# Patient Record
Sex: Female | Born: 1970
Health system: Southern US, Community
[De-identification: ages and names within clinical notes are randomized; demographics above are authoritative.]

## PROBLEM LIST (undated history)

## (undated) DIAGNOSIS — C50919 Malignant neoplasm of unspecified site of unspecified female breast: Secondary | ICD-10-CM

## (undated) DIAGNOSIS — F32A Depression, unspecified: Secondary | ICD-10-CM

## (undated) DIAGNOSIS — G47419 Narcolepsy without cataplexy: Secondary | ICD-10-CM

## (undated) DIAGNOSIS — C50911 Malignant neoplasm of unspecified site of right female breast: Secondary | ICD-10-CM

## (undated) DIAGNOSIS — F329 Major depressive disorder, single episode, unspecified: Secondary | ICD-10-CM

## (undated) HISTORY — DX: Narcolepsy without cataplexy: G47.419

## (undated) HISTORY — DX: Malignant neoplasm of unspecified site of unspecified female breast: C50.919

## (undated) HISTORY — DX: Major depressive disorder, single episode, unspecified: F32.9

## (undated) HISTORY — DX: Depression, unspecified: F32.A

## (undated) HISTORY — PX: CHOLECYSTECTOMY: SHX55

## (undated) HISTORY — DX: Malignant neoplasm of unspecified site of right female breast: C50.911

---

## 1998-07-04 ENCOUNTER — Encounter: Admission: RE | Admit: 1998-07-04 | Discharge: 1998-10-02 | Payer: Self-pay | Admitting: *Deleted

## 2004-01-30 ENCOUNTER — Inpatient Hospital Stay (HOSPITAL_COMMUNITY): Admission: AD | Admit: 2004-01-30 | Discharge: 2004-01-30 | Payer: Self-pay | Admitting: Family Medicine

## 2016-10-16 ENCOUNTER — Telehealth (HOSPITAL_COMMUNITY): Payer: Self-pay | Admitting: Emergency Medicine

## 2016-10-16 NOTE — Telephone Encounter (Signed)
Called pt and introduced myself.  Explained the role of the navigator.  Made sure she had my contact information.  Pt has not seen a surgeon yet but when she does she would like for it to be in [place].

## 2016-10-17 ENCOUNTER — Encounter (HOSPITAL_COMMUNITY): Payer: Self-pay | Admitting: Oncology

## 2016-10-17 DIAGNOSIS — C50911 Malignant neoplasm of unspecified site of right female breast: Secondary | ICD-10-CM | POA: Insufficient documentation

## 2016-10-17 HISTORY — DX: Malignant neoplasm of unspecified site of right female breast: C50.911

## 2016-10-23 ENCOUNTER — Encounter (HOSPITAL_COMMUNITY): Payer: Self-pay | Admitting: Hematology

## 2016-10-23 ENCOUNTER — Encounter (HOSPITAL_COMMUNITY): Payer: 59 | Attending: Hematology | Admitting: Hematology

## 2016-10-23 ENCOUNTER — Encounter (HOSPITAL_COMMUNITY): Payer: 59 | Attending: Oncology

## 2016-10-23 VITALS — BP 148/91 | HR 104 | Temp 98.6°F | Resp 18 | Ht 67.0 in | Wt 185.0 lb

## 2016-10-23 DIAGNOSIS — C50111 Malignant neoplasm of central portion of right female breast: Secondary | ICD-10-CM

## 2016-10-23 DIAGNOSIS — Z808 Family history of malignant neoplasm of other organs or systems: Secondary | ICD-10-CM | POA: Diagnosis not present

## 2016-10-23 DIAGNOSIS — Z803 Family history of malignant neoplasm of breast: Secondary | ICD-10-CM

## 2016-10-23 DIAGNOSIS — C50311 Malignant neoplasm of lower-inner quadrant of right female breast: Secondary | ICD-10-CM

## 2016-10-23 DIAGNOSIS — Z170 Estrogen receptor positive status [ER+]: Secondary | ICD-10-CM | POA: Diagnosis not present

## 2016-10-23 DIAGNOSIS — Z809 Family history of malignant neoplasm, unspecified: Secondary | ICD-10-CM | POA: Diagnosis not present

## 2016-10-23 DIAGNOSIS — Z807 Family history of other malignant neoplasms of lymphoid, hematopoietic and related tissues: Secondary | ICD-10-CM | POA: Diagnosis not present

## 2016-10-23 LAB — CBC WITH DIFFERENTIAL/PLATELET
BASOS ABS: 0 10*3/uL (ref 0.0–0.1)
BASOS PCT: 0 %
EOS ABS: 0.1 10*3/uL (ref 0.0–0.7)
EOS PCT: 1 %
HCT: 40.3 % (ref 36.0–46.0)
HEMOGLOBIN: 14.1 g/dL (ref 12.0–15.0)
LYMPHS ABS: 2.2 10*3/uL (ref 0.7–4.0)
LYMPHS PCT: 21 %
MCH: 30.5 pg (ref 26.0–34.0)
MCHC: 35 g/dL (ref 30.0–36.0)
MCV: 87.2 fL (ref 78.0–100.0)
Monocytes Absolute: 0.7 10*3/uL (ref 0.1–1.0)
Monocytes Relative: 7 %
NEUTROS ABS: 7.3 10*3/uL (ref 1.7–7.7)
NEUTROS PCT: 71 %
PLATELETS: 321 10*3/uL (ref 150–400)
RBC: 4.62 MIL/uL (ref 3.87–5.11)
RDW: 12.6 % (ref 11.5–15.5)
WBC: 10.3 10*3/uL (ref 4.0–10.5)

## 2016-10-23 LAB — COMPREHENSIVE METABOLIC PANEL
ALBUMIN: 4.1 g/dL (ref 3.5–5.0)
ALK PHOS: 79 U/L (ref 38–126)
ALT: 27 U/L (ref 14–54)
AST: 21 U/L (ref 15–41)
Anion gap: 8 (ref 5–15)
BUN: 6 mg/dL (ref 6–20)
CHLORIDE: 100 mmol/L — AB (ref 101–111)
CO2: 28 mmol/L (ref 22–32)
CREATININE: 0.71 mg/dL (ref 0.44–1.00)
Calcium: 9.7 mg/dL (ref 8.9–10.3)
GFR calc non Af Amer: >60 mL/min (ref >60)
GLUCOSE: 94 mg/dL (ref 65–99)
Potassium: 3.3 mmol/L — ABNORMAL LOW (ref 3.5–5.1)
SODIUM: 136 mmol/L (ref 135–145)
Total Bilirubin: 0.5 mg/dL (ref 0.3–1.2)
Total Protein: 7.3 g/dL (ref 6.5–8.1)

## 2016-10-23 NOTE — Patient Instructions (Signed)
[place] at [facility] Discharge Instructions  RECOMMENDATIONS MADE BY THE CONSULTANT AND ANY TEST RESULTS WILL BE SENT TO YOUR REFERRING PHYSICIAN.  You were seen today by Dr. Irene Limbo We will refer you to Dr. Arnoldo Morale for surgery We will refer you to our genetic counselor We will refer you to radiation oncology in [facility] will have lab work today and we will call you with those results. Follow up after surgery See Amy up front for appointments   Thank you for choosing [place] at [facility] to provide your oncology and hematology care.  To afford each patient quality time with our provider, please arrive at least 15 minutes before your scheduled appointment time.    If you have a lab appointment with the [facility] please come in thru the  Main Entrance and check in at the main information desk  You need to re-schedule your appointment should you arrive 10 or more minutes late.  We strive to give you quality time with our providers, and arriving late affects you and other patients whose appointments are after yours.  Also, if you no show three or more times for appointments you may be dismissed from the clinic at the providers discretion.     Again, thank you for choosing [facility]  Our hope is that these requests will decrease the amount of time that you wait before being seen by our physicians.       _____________________________________________________________  Should you have questions after your visit to [facility], please contact our office at [phone] between the hours of 8:30 a.m. and 4:30 p.m.  Voicemails left after 4:30 p.m. will not be returned until the following business day.  For prescription refill requests, have your pharmacy contact our office.       Resources For Cancer Patients and their Caregivers ? American Cancer Society: Can assist with transportation, wigs, general needs,  runs Look Good Feel Better.        670-682-9452 ? Cancer Care: Provides financial assistance, online support groups, medication/co-pay assistance.  1-800-813-HOPE 215-745-9134) ? [place] Assists [place] cancer patients and their families through emotional , educational and financial support.  406-490-2207 ? [place] DSS Where to apply for food stamps, Medicaid and utility assistance. 202-793-6182 ? [facility]: Transportation to medical appointments. (224)448-1852 ? Social Security Administration: May apply for disability if have a Stage IV cancer. 6012509505 367-318-3851 ? [employer], Disability and Transit Services: Assists with nutrition, care and transit needs. [place] Support Programs: @10RELATIVEDAYS @ > Cancer Support Group  2nd Tuesday of the month 1pm-2pm, Journey Room  > Creative Journey  3rd Tuesday of the month 1130am-1pm, Journey Room  > Look Good Feel Better  1st Wednesday of the month 10am-12 noon, Journey Room (Call [place] to register [phone])

## 2016-10-23 NOTE — Progress Notes (Signed)
Mar[facility] Kitchen    HEMATOLOGY/ONCOLOGY CONSULTATION NOTE  Date of Service: 10/23/2016  PCP: Gar Ponto  [facility]  CHIEF COMPLAINTS/PURPOSE OF CONSULTATION:  Newly diagnosed Breast Cancer  HISTORY OF PRESENTING ILLNESS:   Kylie Howard is a wonderful 46 y.o. female who has been referred to [place] by Dr Gar Ponto ([facility]) for evaluation and management of newly diagnosed breast cancer.  Patient reports that she was in her usual state of health until mid February when she noted a gumball sized breast lump in bottom central part of her right breast. She notes that she notices this when she was having a bath.  Reports that her previous mammogram was in July 2015 and was negative.  Patient had a mammogram and ultrasound done on 09/25/2016 which showed a suspicious right breast mass at 5:00 position measuring 1.4 cm. 4 mm indeterminate nodule in the right breast at 10:00 position. No evidence of right axillary lymphadenopathy. No evidence of left breast lesions.  Patient was set up for a needle core biopsy of her right breast mass at the 5:00 position. This was done on 10/10/2016 and showed invasive ductal carcinoma grade 2 and DCIS. Invasive ductal carcinoma was noted to be ER +95%, PR +60%,  HER-2 negative with a Ki-67 of 25%.  Patient reports that a biopsy of the right breast 10:00 lesion was attempted and this was thought to be a cyst.  She was referred to [place] for further evaluation and management.  MEDICAL HISTORY:  Past Medical History:  Diagnosis Date  . Depression   . Invasive ductal carcinoma of breast, female, right ([place]) 10/17/2016  . Narcolepsy   History of fibrocystic breast disease History of polycystic ovarian syndrome Significant narcolepsy controlled with Adderall for the last 8-10 years. Patient notes no sleep apnea was noted.   Procedure Laterality Date  . CESAREAN SECTION  2000,2005  . CHOLECYSTECTOMY      SOCIAL HISTORY: Social  History   Social History  . Marital status: Married    Spouse name: N/A  . Number of children: N/A  . Years of education: N/A   Occupational History  . Not on file.   Social History Main Topics  . Smoking status: Never Smoker  . Smokeless tobacco: Never Used  . Alcohol use No  . Drug use: No  . Sexual activity: Yes   Other Topics Concern  . Not on file   Social History Narrative  . No narrative on file  She has been married to her husband Nicki Reaper for 22 years. Denies ever being a smoker. No alcohol use. Has twin boys age 85 years - Clomid assisted pregnancy. One 47 year old girl. 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   FAMILY HISTORY: Notes that she does not know  her dad's side of the family Notes that she has multiple cancers on her mom's side of the family Maternal grandmother- appendiceal carcinoma in her 73s treated with Hipec MGM's niece had breast cancer in her 53s. Her sister had lymphoma. Patient knows her more cancers on her mom's side of the family but does not know any other details.    . Allergies  Allergen Reactions  . Wellbutrin [Bupropion]     tremors    MEDICATIONS:  Current Outpatient Prescriptions  Medication Sig Dispense Refill  . amphetamine-dextroamphetamine (ADDERALL) 30 MG tablet     . DULoxetine (CYMBALTA) 60 MG capsule  No current facility-administered medications for this visit.     REVIEW OF SYSTEMS:    10 Point review of Systems was done is negative except as noted above.  PHYSICAL EXAMINATION: ECOG PERFORMANCE STATUS: 0 - Asymptomatic  . Vitals:   10/23/16 1323  BP: (!) 148/91  Pulse: (!) 104  Resp: 18  Temp: 98.6 F (37 C)   Filed Weights   10/23/16 1323  Weight: 185 lb (83.9 kg)   .Body mass index is 43 kg/m.  GENERAL:alert, in no acute distress and comfortable SKIN: no acute rashes, no significant lesions EYES: conjunctiva are pink and  non-injected, sclera anicteric OROPHARYNX: MMM, no exudates, no oropharyngeal erythema or ulceration NECK: supple, no JVD LYMPH:  no palpable lymphadenopathy in the cervical, axillary or inguinal regions LUNGS: clear to auscultation b/l with normal respiratory effort BREAST: (done with RN as Chaperone). No palpable left breast lumps. Rt breast palpable lesion at 5 O clock measuring 1-2 cms in size . No palpable rt axillary or other lymphadenopathy. HEART: regular rate & rhythm ABDOMEN:  normoactive bowel sounds , non tender, not distended. Extremity: no pedal edema PSYCH: alert & oriented x 3 with fluent speech NEURO: no focal motor/sensory deficits  LABORATORY DATA:   CBC Latest Ref Rng & Units 10/23/2016  WBC 4.0 - 10.5 K/uL 10.3  Hemoglobin 12.0 - 15.0 g/dL 14.1  Hematocrit 36.0 - 46.0 % 40.3  Platelets 150 - 400 K/uL 321    CMP Latest Ref Rng & Units 10/23/2016  Glucose 65 - 99 mg/dL 94  BUN 6 - 20 mg/dL 6  Creatinine 0.44 - 1.00 mg/dL 0.71  Sodium 135 - 145 mmol/L 136  Potassium 3.5 - 5.1 mmol/L 3.3(L)  Chloride 101 - 111 mmol/L 100(L)  CO2 22 - 32 mmol/L 28  Calcium 8.9 - 10.3 mg/dL 9.7  Total Protein 6.5 - 8.1 g/dL 7.3  Total Bilirubin 0.3 - 1.2 mg/dL 0.5  Alkaline Phos 38 - 126 U/L 79  AST 15 - 41 U/L 21  ALT 14 - 54 U/L 27    RADIOGRAPHIC STUDIES: I have personally reviewed the radiological images as listed and agreed with the findings in the report. No results found.  ASSESSMENT & PLAN:   46 yo with   1) New diagnosed cT1c N0 Mx (Stage I) Invasive ductal carcinoma grade 2 with DCIS ER/PR +ve , Her 2 negative Patient is premenopausal  Patient had a mammogram and ultrasound done on 09/25/2016 which showed a suspicious right breast mass at 5:00 position measuring 1.4 cm. 4 mm indeterminate nodule in the right breast at 10:00 position. No evidence of right axillary lymphadenopathy. No evidence of left breast lesions.  Patient was set up for a needle core biopsy  of her right breast mass at the 5:00 position. This was done on 10/10/2016 and showed invasive ductal carcinoma grade 2 and DCIS. Invasive ductal carcinoma was noted to be ER +95%, PR +60%,  HER-2 negative with a Ki-67 of 25%.  PLAN -Patient had baseline labs today. -Borderline low potassium was recommended to increase dietary potassium intake. -Was referred to general surgery Dr. Arnoldo Morale to evaluate for breast surgery for her early-stage right breast cancer. -Will need Oncotype DX testing on the tumor to determine role for adjuvant chemotherapy. -We shall see her back after her surgery and with the Oncotype DX testing results to determine final adjuvant treatment plan and to determine if she might benefit from adjuvant chemotherapy. -She will certainly need adjuvant endocrine therapy eventually. -Assuming  she will likely have lumpectomy was given a referral to radiation oncology at [place], [place] . If she attended for adjuvant chemotherapy that radiation would be after adjuvant chemotherapy. -Given her young age less than 89 years and family history of cancer on her mother's side was given a referral to genetic counseling/testing . -Emotional support provided in the setting of significant emotional anxiety. Patient has no suicidal or homicidal ideations. Her husband Nicki Reaper is very supportive. -We went over NCCN treatment guidelines.  Return to clinic with M.D./PA after surgery to determine adjuvant treatment plan with labs.  All of the patients questions were answered with apparent satisfaction. The patient knows to call the clinic with any problems, questions or concerns.  I spent 50 minutes counseling the patient face to face. The total time spent in the appointment was 60 minutes and more than 50% was on counseling and direct patient cares.    Sullivan Lone MD [place] AAHIVMS [facility] Hematology/Oncology Physician [facility]  (Office):       [phone] (Work cell):   [phone] (Fax):           [phone]  10/23/2016 1:46 PM

## 2016-10-30 ENCOUNTER — Encounter: Payer: Self-pay | Admitting: *Deleted

## 2016-10-30 NOTE — Progress Notes (Signed)
[place] Psychosocial Distress Screening Clinical Social Work  Clinical Social Work was referred by distress screening protocol.  The patient scored a 10 on the Psychosocial Distress Thermometer which indicates severe distress. Clinical Social Worker reviewed chart and phoned pt to assess for distress and other psychosocial needs. In comment section, pt had stated she had a stressful job and did not wish for CSW consult at this time. CSW reached out to pt due to 10 on distress screen. CSW introduced self, explained role of CSW/Pt and Family Support Team, support groups and other resources to assist. Pt reports to be doing better, overall. She reports to be less anxious. She is eager for a plan and meets with surgeon soon. She states she may want referral to surgeon in [employer] if she needs reconstruction. CSW encouraged pt to reach out to team and CSW as needed.    ONCBCN DISTRESS SCREENING 10/23/2016  Screening Type Initial Screening  Distress experienced in past week (1-10) 10  Practical problem type Work/school  Physician notified of physical symptoms Yes  Referral to clinical psychology No  Referral to clinical social work No  Referral to dietition No  Referral to financial advocate No  Referral to support programs No  Referral to palliative care No    Clinical Social Worker follow up needed: No.  If yes, follow up plan:  Loren Racer, Jay, OSW-C [place] Tuesdays   [phone]

## 2016-11-01 ENCOUNTER — Encounter: Payer: Self-pay | Admitting: General Surgery

## 2016-11-01 ENCOUNTER — Ambulatory Visit (INDEPENDENT_AMBULATORY_CARE_PROVIDER_SITE_OTHER): Payer: 59 | Admitting: General Surgery

## 2016-11-01 VITALS — BP 145/92 | HR 99 | Temp 96.8°F | Resp 18 | Ht 66.0 in | Wt 186.0 lb

## 2016-11-01 DIAGNOSIS — C50311 Malignant neoplasm of lower-inner quadrant of right female breast: Secondary | ICD-10-CM | POA: Diagnosis not present

## 2016-11-01 NOTE — Progress Notes (Signed)
Kylie Howard; [id number]; 05-13-1971   HPI Patient is a 46 year old white female who recently was diagnosed with invasive ductal carcinoma the right breast. She states she found a lump in her breast in February 2018. She underwent mammogram, ultrasound, and core biopsy of the mass which was in the lower, inner quadrant of the breast. Biopsy is positive for invasive ductal carcinoma. She had another area at the 10:00 position in the right breast which ultimately was a cyst. She denies any immediate family history of breast cancer. No nipple discharge has been noted. She is still having her menses. Past Medical History:  Diagnosis Date  . Depression   . Invasive ductal carcinoma of breast, female, right ([place]) 10/17/2016  . Narcolepsy     Past Surgical History:  Procedure Laterality Date  . CESAREAN SECTION  2000,2005  . CHOLECYSTECTOMY      History reviewed. No pertinent family history.  Current Outpatient Prescriptions on File Prior to Visit  Medication Sig Dispense Refill  . amphetamine-dextroamphetamine (ADDERALL) 30 MG tablet     . DULoxetine (CYMBALTA) 60 MG capsule      No current facility-administered medications on file prior to visit.     Allergies  Allergen Reactions  . Wellbutrin [Bupropion]     tremors    History  Alcohol Use No    History  Smoking Status  . Never Smoker  Smokeless Tobacco  . Never Used    Review of Systems  Constitutional: Positive for malaise/fatigue.  HENT: Positive for sinus pain.   Eyes: Positive for blurred vision.  Respiratory: Negative.   Cardiovascular: Negative.   Gastrointestinal: Negative.   Genitourinary: Negative.   Musculoskeletal: Positive for joint pain and neck pain.  Skin: Positive for itching and rash.  Neurological: Positive for dizziness and tremors.  Endo/Heme/Allergies: Negative.   Psychiatric/Behavioral: Negative.     Objective   Vitals:   11/01/16 1104  BP: (!) 145/92  Pulse: 99  Resp: 18   Temp: (!) 96.8 F (36 C)    Physical Exam  Constitutional: She is oriented to person, place, and time and well-developed, well-nourished, and in no distress.  HENT:  Head: Normocephalic and atraumatic.  Neck: Normal range of motion.  Cardiovascular: Normal rate and normal heart sounds.   No murmur heard. Pulmonary/Chest: Effort normal and breath sounds normal. She has no wheezes. She has no rales.  Abdominal: Soft. Bowel sounds are normal. She exhibits no distension.  Neurological: She is alert and oriented to person, place, and time.  Skin: Skin is warm and dry.  Vitals reviewed. Breast:  Right breast with dominant mass in the lower, inner quadrant, irregular.  No nipple discharge, dimpling.  Axilla negative for palpable nodes.  Left breast without dominant mass, nipple discharge, dimpling.  Axilla negative for palpable.  Mammogram, U/S, path reports reviewed. ER/PR +, HER 2 negative Assessment  Right breast cancer, invasive ductal Plan   Discussed all surgical options with patient including partial mastectomy/sentinel lymph node biopsy/XRT, modified radical mastectomy with/without reconstructive surgery.  She would like to discuss possible reconstructive surgery with a Plastic Surgeon.  Will refer to [facility] for consultation.  Follow up here 11/13/16.  She knows that is she decides to have immediate reconstruction, that would have to done in [place] with another Education officer, environmental.

## 2016-11-01 NOTE — Patient Instructions (Addendum)
Breast Cancer, Female Breast cancer is an abnormal growth of tissue (tumor) in the breast that is cancerous (malignant). Unlike noncancerous (benign) tumors, malignant tumors can spread to other parts of your body. The most common type of female breast cancer begins in the milk ducts (ductal carcinoma). Breast cancer is one of the most common types of cancer in women. What are the causes? The exact cause of female breast cancer is unknown. What increases the risk?  Age older than 55 years.  Family history of breast cancer.  Having the BRCA1 and BRCA2 genes.  Personal history of radiation exposure.  Obesity.  Menstrual periods that begin before age 12 years.  Menopause that begins after age 55 years.  Pregnant for the first time at the age of 35 years or older.  Using hormone therapy.  Drinking more than one alcoholic drink per day. What are the signs or symptoms?  A painless lump in your breast.  Changes in the size or shape of your breast.  Breast skin changes, such as puckering or dimpling.  Nipple abnormalities, such as scaling, crustiness, redness, or pulling in (retraction).  Nipple discharge that is bloody or clear. How is this diagnosed? Your health care provider will ask about your medical history. He or she may also perform a number of procedures, such as:  A physical exam. This will involve feeling the tissue around the breast and under the arms.  Taking a sample of nipple discharge. The sample will be examined under a microscope.  Breast X-rays (mammogram), breast ultrasound exams, or an MRI.  Taking a tissue sample (biopsy) from the breast. The sample will be examined under a microscope to look for cancer cells.  Your cancer will be staged to determine its severity and extent. Staging is a careful attempt to find out the size of the tumor, whether the cancer has spread, and if so, to what parts of the body. You may need to have more tests to determine the  stage of your cancer:  Stage 0-The tumor has not spread to other breast tissue.  Stage I-The cancer is only found in the breast. The tumor may be up to  in (2 cm) wide.  Stage II-The cancer has spread to nearby lymph nodes. The tumor may be up to 2 in (5 cm) wide.  Stage III-The cancer has spread to more distant lymph nodes. The tumor may be larger than 2 in (5 cm) wide.  Stage IV-The cancer has spread to other parts of the body, such as the bones, brain, liver, or lungs.  How is this treated? Depending on the type and stage, female breast cancer may be treated with one or more of the following therapies:  Surgery to remove just the tumor (lumpectomy) or the entire breast (mastectomy). Lymph nodes may also be removed.  Radiation therapy, which uses high-energy rays to kill cancer cells.  Chemotherapy, which is the use of drugs to kill cancer cells.  Hormone therapy, which involves taking medicine to adjust the hormone levels in your body. You may take medicine to decrease your estrogen levels. This can help stop cancer cells from growing.  Follow these instructions at home:  Take medicines only as directed by your health care provider.  Maintain a healthy diet.  Consider joining a support group. This may help you learn to cope with the stress of having breast cancer.  Keep all follow-up appointments as directed by your health care provider. Contact a health care provider if:    sudden increase in pain.  You notice a new lump in either breast or under your arm.  You develop swelling in either arm or hand.  You lose weight without trying.  You have a fever.  You notice new fatigue or weakness. Get help right away if:  You have chest pain or trouble breathing.  You faint. This information is not intended to replace advice given to you by your health care provider. Make sure you discuss any questions you have with your health care provider. Document Released:  10/31/2005 Document Revised: 12/01/2015 Document Reviewed: 09/16/2013 Elsevier Interactive Patient Education  2017 [employer]. Total or Modified Radical Mastectomy A total mastectomy and a modified radical mastectomy are types of surgery for breast cancer. If you are having a total mastectomy (simple mastectomy), your entire breast will be removed. If you are having a modified radical mastectomy, your breast and nipple will be removed along with the lymph nodes under your arm. You may also have some of the lining over the muscle tissues under your breast removed. Let your health care provider know about: Any allergies you have. All medicines you are taking, including vitamins, herbs, eye drops, creams, and over-the-counter medicines. Previous problems you or members of your family have had with the use of anesthetics. Any blood disorders you have. Any surgeries you have had. Any medical conditions you have. What are the risks? Generally, this is a safe procedure. However, problems may occur, including: Pain. Infection. Bleeding. Scar tissue. Chest numbness on the side of the surgery. Fluid buildup under the skin flaps where your breast was removed (seroma). Sensation of throbbing or tingling. Stress or sadness from losing your breast. If you have the lymph nodes under your arm removed, you may have arm swelling, weakness, or numbness on the same side of your body as your surgery. What happens before the procedure? Ask your health care provider about: Changing or stopping your regular medicines. This is especially important if you are taking diabetes medicines or blood thinners. Taking medicines such as aspirin and ibuprofen. These medicines can thin your blood. Do not take these medicines before your procedure if your health care provider instructs you not to. Follow your health care provider's instructions about eating or drinking restrictions. You may be checked for extra fluid  around your lymph nodes (lymphedema). Plan to have someone take you home after the procedure. What happens during the procedure? An IV tube will be inserted into one of your veins. You will be given a medicine that makes you fall asleep (general anesthetic). Your breast will be cleaned with a germ-killing solution (antiseptic). A wide incision will be made around your nipple. The skin and nipple inside the incision will be removed along with all breast tissue. If you are having a modified radical mastectomy: The lining over your chest muscles will be removed. The incision may be extended to reach the lymph nodes under your arm, or a second incision may be made. The lymph nodes will be removed. You may have a drainage tube inserted into your incision to collect fluid that builds up after surgery. This tube is connected to a suction bulb. Your incision or incisions will be closed with stitches (sutures). A bandage (dressing) will be placed over your breast and under your arm. The procedure may vary among health care providers and hospitals. What happens after the procedure? You will be moved to a recovery area. Your blood pressure, heart rate, breathing rate, and blood oxygen level will  be monitored often until the medicines you were given have worn off. You will be given pain medicine as needed. After a while, you will be taken to a hospital room. You will be encouraged to get up and walk as soon as you can. Your IV tube can be removed when you are able to eat and drink. Your drain may be removed before you go home from the hospital, or you may be sent home with your drain and suction bulb. This information is not intended to replace advice given to you by your health care provider. Make sure you discuss any questions you have with your health care provider. Document Released: 04/17/2001 Document Revised: 03/29/2016 Document Reviewed: 04/07/2014 Elsevier Interactive Patient Education  2017  [place]. Partial Mastectomy With or Without Axillary Lymph Node Removal Partial mastectomy with or without axillary lymph node removal is a surgery to remove breast cancer. It is a type of breast-conserving surgery. This means that the cancerous tissue is removed but the breast remains intact. During this procedure, the tumor and a small rim of healthy tissue surrounding it will be removed. Lymph nodes under your arm may also be removed and tested to find out if the cancer has spread. Let your health care provider know about: Any allergies you have. All medicines you are taking, including vitamins, herbs, eye drops, creams, and over-the-counter medicines. Previous problems you or members of your family have had with the use of anesthetics. Any blood disorders you have. Any surgeries you have had. Any medical conditions you have. What are the risks? Generally, this is a safe procedure. However, problems may occur, including: A change in the way your breast looks and feels. Breast pain. Infection. Bleeding. Pain, swelling, weakness, or numbness in the arm on the side of your surgery. What happens before the procedure? Ask your health care provider about: Changing or stopping your regular medicines. This is especially important if you are taking diabetes medicines or blood thinners. Taking medicines such as aspirin and ibuprofen. These medicines can thin your blood. Do not take these medicines before your procedure if your health care provider instructs you not to. Follow your health care provider's instructions about eating or drinking restrictions. You may be checked for extra fluid around your lymph nodes (lymphedema). What happens during the procedure? An IV tube will be inserted into one of your veins. You will be given a medicine that makes you fall asleep (general anesthetic). Your surgeon may Kensli Bowley your breast to indicate the location of your tumor and to plan the incision. Your  breast will be cleaned with a germ-killing solution (antiseptic). Your surgeon will make an incision over the area of your breast where the tumor is located. This will usually be a curved incision that follows the normal shape of your breast. The tumor will be removed along with a portion of the tissue that surrounds it. If the tumor is close to the muscles over your chest, some muscle tissue may also be removed. The incision may extend to the lymph nodes under your arm, or a second incision may be made under your arm. Lymph nodes under your arm may be removed. You may have a drainage tube inserted into your incision to collect fluid that builds up after surgery. This tube will be connected to a suction bulb. Your incision or incisions will be closed with stitches (sutures). A bandage (dressing) will be placed over your breast and under your arm. The procedure may vary among health  care providers and hospitals. What happens after the procedure? Your blood pressure, heart rate, breathing rate, and blood oxygen level will be monitored often until the medicines you were given have worn off. You will be given pain medicine as needed. You will be encouraged to get up and walk as soon as you can. Your IV tube will be removed when you are able to eat and drink. Your drain may be removed before you go home from the hospital, or you may be sent home with your drain and suction bulb. This information is not intended to replace advice given to you by your health care provider. Make sure you discuss any questions you have with your health care provider. Document Released: 12/07/2014 Document Revised: 03/29/2016 Document Reviewed: 04/07/2014 Elsevier Interactive Patient Education  2017 [employer].

## 2016-11-13 ENCOUNTER — Ambulatory Visit: Payer: 59 | Admitting: General Surgery

## 2016-11-15 ENCOUNTER — Telehealth (HOSPITAL_COMMUNITY): Payer: Self-pay | Admitting: Emergency Medicine

## 2016-11-15 NOTE — Telephone Encounter (Signed)
Called pt to see when or if she is going to have surgery.  Pt has met with the reconstructive surgeon and she plans to do that.  So she is going to see a Education officer, environmental in [employer].  I have asked her to call me when she knows the surgery date so I can watch for her pathology to come back and send off for the testing that Dr Irene Limbo wanted.  It takes about 2 weeks for these results to come back.  Then we will get her set up for a follow up visit to come back in and discuss these results.  She verbalized understanding.

## 2016-11-16 ENCOUNTER — Ambulatory Visit: Payer: Self-pay | Admitting: General Surgery

## 2016-11-16 DIAGNOSIS — C50311 Malignant neoplasm of lower-inner quadrant of right female breast: Secondary | ICD-10-CM

## 2016-11-16 DIAGNOSIS — Z170 Estrogen receptor positive status [ER+]: Principal | ICD-10-CM

## 2016-11-19 ENCOUNTER — Telehealth: Payer: Self-pay | Admitting: *Deleted

## 2016-11-19 NOTE — Telephone Encounter (Signed)
Spoke with patient and rescheduled her genetic appt to 4/17 at 11am per Dr. Marlou Starks request.

## 2016-11-20 ENCOUNTER — Ambulatory Visit (HOSPITAL_BASED_OUTPATIENT_CLINIC_OR_DEPARTMENT_OTHER): Payer: 59 | Admitting: Genetics

## 2016-11-20 ENCOUNTER — Encounter: Payer: Self-pay | Admitting: Genetics

## 2016-11-20 ENCOUNTER — Other Ambulatory Visit: Payer: 59

## 2016-11-20 DIAGNOSIS — C50911 Malignant neoplasm of unspecified site of right female breast: Secondary | ICD-10-CM | POA: Diagnosis not present

## 2016-11-20 DIAGNOSIS — Z809 Family history of malignant neoplasm, unspecified: Secondary | ICD-10-CM

## 2016-11-20 DIAGNOSIS — Z808 Family history of malignant neoplasm of other organs or systems: Secondary | ICD-10-CM | POA: Diagnosis not present

## 2016-11-20 DIAGNOSIS — Z315 Encounter for genetic counseling: Secondary | ICD-10-CM

## 2016-11-20 DIAGNOSIS — Z170 Estrogen receptor positive status [ER+]: Principal | ICD-10-CM

## 2016-11-20 NOTE — Progress Notes (Signed)
REFERRING PROVIDER: Brunetta Genera, MD [place], [place] [id number]  PRIMARY PROVIDER:  Gar Ponto, MD  PRIMARY REASON FOR VISIT:  1. Malignant neoplasm of right breast in female, estrogen receptor positive, unspecified site of breast ([place])   2. Family history of cancer     HISTORY OF PRESENT ILLNESS:   Kylie Howard, a 46 y.o. female, was seen for a [facility] cancer genetics consultation at the request of Dr. Irene Howard due to a personal and family history of cancer.  Kylie Howard presents to clinic today to discuss the possibility of a hereditary predisposition to cancer, genetic testing, and to further clarify her future cancer risks, as well as potential cancer risks for family members.   In March 2018, at the age of 27, Kylie Howard was diagnosed with ER/PR positive HER2 negative invasive ductal carcinoma of the right breast. Her treatment is pending. She states that she plans to use her genetic testing results to help with decision-making regarding her surgery.   CANCER HISTORY:    Invasive ductal carcinoma of breast, female, right ([place])   10/10/2016 Procedure    Needle core biopsy of right breast 5 o'clock position      10/12/2016 Pathology Results    Invasive ductal carcinoma, grade 2 with DCIS.  ER 95% POSITIVE, PR 60% POSITIVE, Ki-67: 25%, HER-2 NEGATIVE.       HORMONAL RISK FACTORS:  Menarche was at age 24.  First live birth at age 32.  OCP use for approximately 12-14 years.  Ovaries intact: yes.  Hysterectomy: no.  Menopausal status: perimenopausal.  HRT use: 0 years. Colonoscopy: no; not examined. Mammogram within the last year: yes. Number of breast biopsies: 1. Up to date with pelvic exams:  no. Any excessive radiation exposure in the past:  no  Past Medical History:  Diagnosis Date  . Depression   . Invasive ductal carcinoma of breast, female, right ([place]) 10/17/2016  . Narcolepsy     Past Surgical History:  Procedure Laterality Date   . CESAREAN SECTION  2000,2005  . CHOLECYSTECTOMY      Social History   Social History  . Marital status: Married    Spouse name: N/A  . Number of children: N/A  . Years of education: N/A   Social History Main Topics  . Smoking status: Never Smoker  . Smokeless tobacco: Never Used  . Alcohol use No  . Drug use: No  . Sexual activity: Yes   Other Topics Concern  . Not on file   Social History Narrative  . No narrative on file     FAMILY HISTORY:  We obtained a detailed, 4-generation family history.  Significant diagnoses are listed below: Family History  Problem Relation Age of Onset  . Cancer Maternal Grandmother 52    appendiceal with metastases d.72s  . Cancer Paternal Grandfather 12    laryngeal   Kylie Howard is the only child shared by her parents. She has a maternal half-brother, age 19, without cancers. Kylie Howard has twin sons, ages 39, and a daughter, age 66, who are without cancers.  Kylie Howard' mother is 82 without cancers. Kylie Howard reports that her mother underwent TAH/BSO in her late-30s. Her mother has a sister, age 77, and brother, age 40, without cancers. Kylie Howard' maternal grandmother died in her early 48s with a history of metastatic appendiceal cancer. This grandmother had a sister with lymphoma and ovarian cancer who died in her 51s. Another of Kylie Howard' maternal  grandmother's sisters never had cancer, but had a daughter who died of metastatic breast cancer in her early-50s. Another of Kylie Howard' maternal grandmother's sisters never had cancer, but has a son who currently has prostate cancer. Kylie Howard' maternal grandfather died in his 81s without cancers. His mother died of ovarian cancer in her early-80s.  Kylie Howard' father is 66 without cancers. He had a brother who died at 74 from food poisoning. Another brother is in his early-70s without cancer. Kylie Howard' paternal grandmother died in her 61s without cancers. Her paternal  grandfather had laryngeal cancer and died at 15.  Kylie Howard is unaware of previous family history of genetic testing for hereditary cancer risks. Patient's maternal ancestors are of general Caucasian descent, and paternal ancestors are of [place] descent. There is no reported Ashkenazi Jewish ancestry. There is no known consanguinity.  GENETIC COUNSELING ASSESSMENT: Kylie Howard is a 46 y.o. female with a personal and family history which is somewhat suggestive of a hereditary cancer syndrome and predisposition to cancer. We, therefore, discussed and recommended the following at today's visit.   DISCUSSION: We reviewed the characteristics, features and inheritance patterns of hereditary cancer syndromes. We also discussed genetic testing, including the appropriate family members to test, the process of testing, insurance coverage and turn-around-time for results. We discussed the implications of a negative, positive and/or variant of uncertain significant result. In order to get genetic test results in a timely manner so that Kylie Howard can use these genetic test results for surgical decisions, we recommended Kylie Howard pursue genetic testing for the 9-gene High/Moderate Breast Cancer Risk STAT panel offered by [facility]. If this test is negative, we then recommend [name] pursue reflex genetic testing to [facility]'s 46-gene Common Hereditary Cancers Panel.   Based on [name] personal and family history of cancer, she meets medical criteria for genetic testing. Despite that she meets criteria, she may still have an out of pocket cost. We discussed that if her out of pocket cost for testing is over $100, the laboratory will call and confirm whether she wants to proceed with testing.  If the out of pocket cost of testing is less than $100 she will be billed by the genetic testing laboratory.   PLAN: After considering the risks, benefits, and limitations, [name]  provided informed  consent to pursue genetic testing and the blood sample was sent to [facility] for analysis of the 9-gene High/Moderate Breast Cancer Risk STAT Panel. Results should be available within approximately 2 weeks' time, at which point they will be disclosed by telephone to Ms. Buckle, as will any additional recommendations warranted by these results. We will then reflex to the remaining genes on [facility]'s 46-gene Common Hereditary Cancers Panel. Ms. Bonk will receive a summary of her genetic counseling visit and a copy of her results once available. This information will also be available in Epic.   Lastly, we encouraged Ms. Kregel to remain in contact with cancer genetics annually so that we can continuously update the family history and inform her of any changes in cancer genetics and testing that may be of benefit for this family.   Ms.  Frech questions were answered to her satisfaction today. Our contact information was provided should additional questions or concerns arise. Thank you for the referral and allowing [place] to share in the care of your patient.   Mal Misty, MS, [facility] Certified Naval [email or web address] phone: [phone]  The patient was seen for a total  of 30 minutes in face-to-face genetic counseling.  This patient was discussed with Drs. Magrinat, Lindi Adie and/or Burr Medico who agrees with the above.    _______________________________________________________________________ For Office Staff:  Number of people involved in session: 1 Was an Intern/ student involved with case: no

## 2016-12-04 ENCOUNTER — Telehealth: Payer: Self-pay | Admitting: Genetics

## 2016-12-04 NOTE — Telephone Encounter (Deleted)
-----   Message from Mal Misty sent at 11/20/2016  3:36 PM EDT ----- Regarding: Call Results Route to Dr. Irene Limbo and Dr. Marlou Starks. Reflex to 46-gene panel.

## 2016-12-04 NOTE — Telephone Encounter (Signed)
Reviewed that the 9-gene high/moderate breast risk STAT panel performed by [facility] was negative for mutations. Will reflex to remaining genes included on [facility]'s 46-gene Common Hereditary Cancer panel. Will call patient when remaining results are available. Final risk assessment and documentation will be made at that time. A portion of her STAT panel results are included below for reference.  

## 2016-12-10 ENCOUNTER — Ambulatory Visit: Payer: Self-pay | Admitting: Genetics

## 2016-12-10 ENCOUNTER — Telehealth: Payer: Self-pay | Admitting: Genetics

## 2016-12-10 DIAGNOSIS — Z1379 Encounter for other screening for genetic and chromosomal anomalies: Secondary | ICD-10-CM | POA: Insufficient documentation

## 2016-12-10 NOTE — Telephone Encounter (Deleted)
-----   Message from Mal Misty sent at 11/20/2016  3:36 PM EDT ----- Regarding: Call Results Route to Dr. Irene Limbo and Dr. Marlou Starks. Reflex to 46-gene panel.

## 2016-12-10 NOTE — Progress Notes (Signed)
HPI: Kylie Howard was previously seen in the [facility] Cancer Genetics clinic on 11/20/2016 due to a personal and family history of breast cancer and concerns regarding a hereditary predisposition to cancer. Please refer to our prior cancer genetics clinic note for more information regarding Kylie Howard' medical, social and family histories, and our assessment and recommendations, at the time. Kylie Howard' recent genetic test results were disclosed to her, as were recommendations warranted by these results. These results and recommendations are discussed in more detail below.  CANCER HISTORY:    Invasive ductal carcinoma of breast, female, right (HCC)   10/10/2016 Procedure    Needle core biopsy of right breast 5 o'clock position      10/12/2016 Pathology Results    Invasive ductal carcinoma, grade 2 with DCIS.  ER 95% POSITIVE, PR 60% POSITIVE, Ki-67: 25%, HER-2 NEGATIVE.        FAMILY HISTORY:  We obtained a detailed, 4-generation family history.  Significant diagnoses are listed below: Family History  Problem Relation Age of Onset  . Cancer Maternal Grandmother 52    appendiceal with metastases d.72s  . Cancer Paternal Grandfather 59    laryngeal   Kylie Howard is the only child shared by her parents. She has a maternal half-brother, age 18, without cancers. Kylie Howard has twin sons, ages 64, and a daughter, age 68, who are without cancers.  Kylie Howard' mother is 24 without cancers. Kylie Howard reports that her mother underwent TAH/BSO in her late-30s. Her mother has a sister, age 63, and brother, age 64, without cancers. Kylie Howard' maternal grandmother died in her early 42s with a history of metastatic appendiceal cancer. This grandmother had a sister with lymphoma and ovarian cancer who died in her 72s. Another of Kylie Howard' maternal grandmother's sisters never had cancer, but had a daughter who died of metastatic breast cancer in her early-50s. Another of Kylie Howard'  maternal grandmother's sisters never had cancer, but has a son who currently has prostate cancer. Kylie Howard' maternal grandfather died in his 71s without cancers. His mother died of ovarian cancer in her early-80s.  Kylie Howard' father is 68 without cancers. He had a brother who died at 70 from food poisoning. Another brother is in his early-70s without cancer. Kylie Howard. Hammond' paternal grandmother died in her 46s without cancers. Her paternal grandfather had laryngeal cancer and died at 1.  Kylie Howard. [place] is unaware of previous family history of genetic testing for hereditary cancer risks. Patient's maternal ancestors are of general Caucasian descent, and paternal ancestors are of [place] descent. There is no reported Ashkenazi Jewish ancestry. There is no known consanguinity.  GENETIC TEST RESULTS: Results on 9 High/Moderate Breast Risk STAT Panel were negative and previously reported to Kylie Howard on 12/04/2016. Reflex genetic testing performed through [facility]'s Common Hereditary Cancer Panel reported out on 12/07/2016 showed no deleterious mutations. [facility] Common Hereditary Cancers Panel includes analysis of the following 46 genes (including those previously reported through the STAT panel): APC, ATM, AXIN2, BARD1, BMPR1A, BRCA1, BRCA2, BRIP1, CDH1, CDKN2A, CHEK2, CTNNA1, DICER1, EPCAM, GREM1, HOXB13, KIT, MEN1, MLH1, MSH2, MSH3, MSH6, MUTYH, NBN, NF1, NTHL1, PALB2, PDGFRA, PMS2, POLD1, POLE, PTEN, RAD50, RAD51C, RAD51D, SDHA, SDHB, SDHC, SDHD, SMAD4, SMARCA4, STK11, TP53, TSC1, TSC2, and VHL.   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 At this time, it is unknown if this variant is associated with increased cancer risk or if this is a normal finding, but most variants  such as this get reclassified to being inconsequential. It should not be used to make medical management decisions. With time, we suspect the lab will determine the significance of  this variant, if any. If we do learn more about it, we will try to contact Kylie Howard to discuss it further. However, it is important to stay in touch with [place] periodically and keep the address and phone number up to date.  The test report will be scanned into EPIC and will be located under the Molecular Pathology section of the Results Review tab.A portion of the result report is included below for reference.    We discussed with Kylie Howard that since the current genetic testing is not perfect, it is possible there may be a gene mutation in one of these genes that current testing cannot detect, but that chance is small. We also discussed, that it is possible that another gene that has not yet been discovered, or that we have not yet tested, is responsible for the cancer diagnoses in the family. Therefore, important to remain in touch with cancer genetics in the future so that we can continue to offer Kylie Howard the most up to date genetic testing.   CANCER SCREENING RECOMMENDATIONS: Given Kylie Howard' personal and family histories, we must interpret these negative results with some caution.  Families with features suggestive of hereditary risk for cancer tend to have multiple family members with cancer, diagnoses in multiple generations and diagnoses before the age of 58. Kylie Howard' family exhibits some of these features. Thus this result may simply reflect our current inability to detect all mutations within these genes or there may be a different gene that has not yet been discovered or tested. However, because no causative or actionable mutations were identified by Kylie Howard. Reynold's genetic testing, her breast cancer treatments and screenings must be based on other aspects of her current diagnosis rather than these results.   RECOMMENDATIONS FOR FAMILY MEMBERS: Women in this family might be at some increased risk of developing cancer, over the general population risk, simply due to the family  history of cancer. We recommended women in this family have a yearly mammogram beginning at age 55, or 15 years younger than the earliest onset of cancer, an annual clinical breast exam, and perform monthly breast self-exams. We specifically discussed that Kylie Howard. Reynold's daughter would need to start annual mammograms at age 34 based on current recommendations. Her daughter should inform her physicians of her family history of cancers so that a personalized screening program can be established. Women in this family should also have a gynecological exam as recommended by their primary provider. All family members should have a colonoscopy by age 80.  Based on Kylie Howard. Flam' family history, other maternal family members are candidates for genetic counseling and testing. Specifically, Kylie Howard. Droll' mother and maternal aunt and uncle may consider genetic testing due to the family history of ovarian cancer. If Kylie Howard. Aloi' family members are interested in learning more about their hereditary cancer risks, they are encouraged to consult their physicians and/or a Dietitian.  Kylie Howard. [place] will let us know if we can be of any assistance in coordinating genetic counseling and/or testing for family members.   FOLLOW-UP: Lastly, we discussed with Kylie Howard. Shall that cancer genetics is a rapidly advancing field and it is possible that new genetic tests will be appropriate for her and/or her family members in the future. We encouraged her to remain in contact with cancer genetics on  an annual basis so we can update her personal and family histories and let her know of advances in cancer genetics that may benefit this family.   Our contact number was provided. Kylie Howard. Veitch' questions were answered to her satisfaction, and she knows she is welcome to call us at anytime with additional questions or concerns.   Mal Misty, Kylie Howard, [facility], [place] Certified Naval [email or web address]

## 2016-12-10 NOTE — Telephone Encounter (Signed)
Results of previously-performed High/Moderate Breast Risk STAT panel were negative and reported to Kylie Howard 12/04/2016.  Reviewed that reflex germline genetic testing to [facility]'s 46-gene Common Hereditary Cancer Panel revealed no pathogenic mutations. This is considered to be a negative result. Testing was performed through [facility]'s 46-gene Common Hereditary Cancers Panel. [facility] Common Hereditary Cancers Panel includes analysis of the following 46 genes: APC, ATM, AXIN2, BARD1, BMPR1A, BRCA1, BRCA2, BRIP1, CDH1, CDKN2A, CHEK2, CTNNA1, DICER1, EPCAM, GREM1, HOXB13, KIT, MEN1, MLH1, MSH2, MSH3, MSH6, MUTYH, NBN, NF1, NTHL1, PALB2, PDGFRA, PMS2, POLD1, POLE, PTEN, RAD50, RAD51C, RAD51D, SDHA, SDHB, SDHC, SDHD, SMAD4, SMARCA4, STK11, TP53, TSC1, TSC2, and VHL.  A variant of uncertain significance (VUS) was noted in TSC2. The specific TSC2 variant is c.745G>A (p.Val249Ile). Discussed that this VUS should not change clinical management.  For more detailed discussion, please see genetic counseling documentation from 12/10/2016. Result report dated 12/07/2016.

## 2016-12-13 ENCOUNTER — Other Ambulatory Visit (HOSPITAL_COMMUNITY): Payer: 59

## 2016-12-13 ENCOUNTER — Encounter (HOSPITAL_COMMUNITY): Payer: Self-pay | Admitting: Genetic Counselor

## 2017-01-24 ENCOUNTER — Telehealth (HOSPITAL_COMMUNITY): Payer: Self-pay | Admitting: Emergency Medicine

## 2017-01-24 NOTE — Telephone Encounter (Signed)
Called pt to make her a follow up appt.  Return on 02/27/2017.  Pt will see radiation on 7/26 and SIMed on 7/27.  Pt will have surgery with plastic surgery reconstruction on 02/01/2017.  I will send of for oncotype once that pathology is back.  We should have these results back before pts comes in for her follow up appt.  I will also fax these results to [facility].  I will contact [place]y navigator at [facility] to get path results at [phone].  Loren Racer message sent for social work to help with resources.

## 2017-01-29 ENCOUNTER — Encounter: Payer: Self-pay | Admitting: *Deleted

## 2017-01-29 NOTE — Progress Notes (Signed)
[place] Clinical Social Work  Clinical Social Work was referred by patient navigator for assessment of psychosocial needs due to possible resource needs due to pending surgery. Clinical Social Worker attempted to contact patient via phone that is listed in EPIC  to offer support and assess for needs.  Pt had message on her phone that her VM box was full and not accepting new messages at this time. CSW had spoken to pt back in March of this year and provided some resources to pt at that time. Pt could utilize [employer] [place] or [employer] for possible assistance, but pt would need to meet their criteria and apply in order to be considered. CSW will attempt to contact pt at later date as no way to leave message at this time.     Clinical Social Work interventions: Contact attempt to review resource options  Loren Racer, [place] Snare [facility] Tuesdays   [phone]

## 2017-02-11 ENCOUNTER — Encounter (HOSPITAL_COMMUNITY): Payer: Self-pay | Admitting: Emergency Medicine

## 2017-02-11 ENCOUNTER — Other Ambulatory Visit (HOSPITAL_COMMUNITY): Payer: Self-pay | Admitting: Emergency Medicine

## 2017-02-11 NOTE — Progress Notes (Signed)
oncotype completed.  Fax confirmed.  They will request pathology from [place].

## 2017-02-27 ENCOUNTER — Inpatient Hospital Stay (HOSPITAL_COMMUNITY): Admission: RE | Admit: 2017-02-27 | Payer: 59 | Source: Ambulatory Visit

## 2017-02-27 ENCOUNTER — Encounter (HOSPITAL_COMMUNITY): Payer: 59 | Attending: Oncology | Admitting: Oncology

## 2017-02-27 ENCOUNTER — Encounter (HOSPITAL_COMMUNITY): Payer: Self-pay

## 2017-02-27 VITALS — BP 148/93 | HR 100 | Resp 16 | Wt 195.0 lb

## 2017-02-27 DIAGNOSIS — C50311 Malignant neoplasm of lower-inner quadrant of right female breast: Secondary | ICD-10-CM

## 2017-02-27 DIAGNOSIS — N951 Menopausal and female climacteric states: Secondary | ICD-10-CM | POA: Diagnosis not present

## 2017-02-27 DIAGNOSIS — C50911 Malignant neoplasm of unspecified site of right female breast: Secondary | ICD-10-CM

## 2017-02-27 MED ORDER — GABAPENTIN 300 MG PO CAPS: 300.0000 mg | ORAL_CAPSULE | Freq: Two times a day (BID) | ORAL | 2 refills | Status: DC

## 2017-02-27 NOTE — Patient Instructions (Signed)
[place] at [facility] Discharge Instructions  RECOMMENDATIONS MADE BY THE CONSULTANT AND ANY TEST RESULTS WILL BE SENT TO YOUR REFERRING PHYSICIAN.  Return in about 6 weeks ( around 04/10/2017) Schedule Zoladex q monthly starting next week / Labs on next visit Thank you for choosing [place] at [facility] to provide your oncology and hematology care.  To afford each patient quality time with our provider, please arrive at least 15 minutes before your scheduled appointment time.    If you have a lab appointment with the [place] please come in thru the  Main Entrance and check in at the main information desk  You need to re-schedule your appointment should you arrive 10 or more minutes late.  We strive to give you quality time with our providers, and arriving late affects you and other patients whose appointments are after yours.  Also, if you no show three or more times for appointments you may be dismissed from the clinic at the providers discretion.     Again, thank you for choosing [facility].  Our hope is that these requests will decrease the amount of time that you wait before being seen by our physicians.       _____________________________________________________________  Should you have questions after your visit to [facility], please contact our office at [phone] between the hours of 8:30 a.m. and 4:30 p.m.  Voicemails left after 4:30 p.m. will not be returned until the following business day.  For prescription refill requests, have your pharmacy contact our office.       Resources For Cancer Patients and their Caregivers ? American Cancer Society: Can assist with transportation, wigs, general needs, runs Look Good Feel Better.        (737)777-8021 ? Cancer Care: Provides financial assistance, online support groups, medication/co-pay assistance.  1-800-813-HOPE 615 786 3074) ? [place] Assists [place] cancer patients and their families through emotional , educational and financial support.  (559)490-1657 ? [place] DSS Where to apply for food stamps, Medicaid and utility assistance. (605) 695-2461 ? [facility]: Transportation to medical appointments. 781-151-7110 ? Social Security Administration: May apply for disability if have a Stage IV cancer. (916)163-8478 774-767-7431 ? [employer], Disability and Transit Services: Assists with nutrition, care and transit needs. [place] Support Programs: @10RELATIVEDAYS @ > Cancer Support Group  2nd Tuesday of the month 1pm-2pm, Journey Room  > Creative Journey  3rd Tuesday of the month 1130am-1pm, Journey Room  > Look Good Feel Better  1st Wednesday of the month 10am-12 noon, Journey Room (Call [place] to register [phone])

## 2017-02-27 NOTE — Addendum Note (Signed)
Addended by: Joanne Gavel T on: 02/27/2017 02:02 PM   Modules accepted: Orders

## 2017-02-27 NOTE — Progress Notes (Signed)
[facility] Follow up:    Kylie Bis, MD [place] [address]   DIAGNOSIS: Cancer Staging Invasive ductal carcinoma of breast, female, right [facility]) Staging form: Breast, AJCC 8th Edition - Pathologic stage from 02/01/2017: Stage IA (pT1c, pN0(sn), cM0, G2, ER: Positive, PR: Positive, HER2: Negative, Oncotype DX score: 21) - Signed by Twana First, MD on 02/27/2017 - Clinical: cT1c, cN0, cM0, ER: Positive, PR: Positive, HER2: Negative - Signed by Twana First, MD on 02/27/2017   SUMMARY OF ONCOLOGIC HISTORY:   Invasive ductal carcinoma of breast, female, right ([place])   10/10/2016 Procedure    Needle core biopsy of right breast 5 o'clock position      10/12/2016 Pathology Results    Invasive ductal carcinoma, grade 2 with DCIS.  ER 95% POSITIVE, PR 60% POSITIVE, Ki-67: 25%, HER-2 NEGATIVE.      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      02/01/2017 Surgery    Right partial mastectomy/oncoplastic reduction Final path: pT1c pN0 ER/PR +, HER2 -          02/13/2017 Oncotype testing    Recurrence score of 21     Kylie Howard is a   46 y.o. female who has been referred to [place] by Dr Gar Ponto ([facility]) for evaluation and management of newly diagnosed breast cancer.  Patient reports that she was in her usual state of health until mid February when she noted a gumball sized breast lump in bottom central part of her right breast. She notes that she notices this when she was having a bath.  Reports that her previous mammogram was in July 2015 and was negative.  Patient had a mammogram and ultrasound done on 09/25/2016 which showed a suspicious right breast mass at 5:00 position measuring 1.4 cm. 4 mm indeterminate nodule in the right breast at 10:00 position. No evidence of  right axillary lymphadenopathy. No evidence of left breast lesions.  Patient was set up for a needle core biopsy of her right breast mass at the 5:00 position. This was done on 10/10/2016 and showed invasive ductal carcinoma grade 2 and DCIS. Invasive ductal carcinoma was noted to be ER +95%, PR +60%,  HER-2 negative with a Ki-67 of 25%.  Patient reports that a biopsy of the right breast 10:00 lesion was attempted and this was thought to be a cyst.  She was referred to [place] for further evaluation and management.  INTERVAL HISTORY: Kylie Howard 46 y.o. female returns for continued follow-up. Since her last visit she has undergone a right partial mastectomy with reconstruction on 02/01/17. Her final pathology was pT1cN0 ER/PR +, HER2 -. She has done well after surgery however she continues to have wound healing issues on the underside of her breasts with open wounds. She has finished a course of antibiotics for her open wounds. They're slowly healing at this time. Patient states that she has been taking tamoxifen since May, which was given to her by one of the surgeons that she went to see in consultation. She states that she has been tolerating tamoxifen well except for severe hot flashes. She states that she is drenched in sweat all the time. Otherwise she has no complaints.   Patient Active Problem List   Diagnosis Date Noted  . Genetic testing 12/10/2016  . Invasive  ductal carcinoma of breast, female, right ([place]) 10/17/2016    is allergic to wellbutrin [bupropion].  MEDICAL HISTORY: Past Medical History:  Diagnosis Date  . Depression   . Invasive ductal carcinoma of breast, female, right ([place]) 10/17/2016  . Narcolepsy     SURGICAL HISTORY: Past Surgical History:  Procedure Laterality Date  . CESAREAN SECTION  2000,2005  . CHOLECYSTECTOMY      SOCIAL HISTORY: Social History   Social History  . Marital status: Married    Spouse name: N/A  . Number of children: N/A  . Years  of education: N/A   Occupational History  . Not on file.   Social History Main Topics  . Smoking status: Never Smoker  . Smokeless tobacco: Never Used  . Alcohol use No  . Drug use: No  . Sexual activity: Yes   Other Topics Concern  . Not on file   Social History Narrative  . No narrative on file    FAMILY HISTORY: Family History  Problem Relation Age of Onset  . Cancer Maternal Grandmother 58       appendiceal with metastases d.72s  . Cancer Paternal Grandfather 23       laryngeal    Review of Systems  Constitutional: Negative for appetite change, chills, fatigue and fever.  HENT:   Negative for hearing loss, lump/mass, mouth sores, sore throat and tinnitus.   Eyes: Negative for eye problems and icterus.  Respiratory: Negative for chest tightness, cough, hemoptysis, shortness of breath and wheezing.   Cardiovascular: Negative for chest pain, leg swelling and palpitations.  Gastrointestinal: Negative for abdominal distention, abdominal pain, blood in stool, diarrhea, nausea and vomiting.  Endocrine: Positive for hot flashes.  Genitourinary: Negative for difficulty urinating, frequency and hematuria.   Musculoskeletal: Negative for arthralgias and neck pain.  Skin: Negative for itching and rash.  Neurological: Negative for dizziness, headaches and speech difficulty.  Hematological: Negative for adenopathy. Does not bruise/bleed easily.  Psychiatric/Behavioral: Negative for confusion. The patient is not nervous/anxious.       PHYSICAL EXAMINATION  ECOG PERFORMANCE STATUS: 1 - Symptomatic but completely ambulatory  Vitals:   02/27/17 1036  BP: (!) 148/93  Pulse: 100  Resp: 16    Physical Exam Constitutional: She is oriented to person, place, and time and well-developed, well-nourished, and in no distress. No distress.  HENT:  Head: Normocephalic and atraumatic.  Mouth/Throat: No oropharyngeal exudate.  Eyes: Pupils are equal, round, and reactive to light.  Conjunctivae are normal. No scleral icterus.  Neck: Normal range of motion. Neck supple. No JVD present.  Cardiovascular: Normal rate, regular rhythm and normal heart sounds.  Exam reveals no gallop and no friction rub.   No murmur heard. Pulmonary/Chest: Effort normal and breath sounds normal. No respiratory distress. She has no wheezes. She has no rales.  Abdominal: Soft. Bowel sounds are normal. She exhibits no distension. There is no tenderness. There is no guarding.  Musculoskeletal: She exhibits no edema or tenderness.  Lymphadenopathy:    She has no cervical adenopathy.  Neurological: She is alert and oriented to person, place, and time. No cranial nerve deficit.  Skin: Skin is warm and dry. No rash noted. No erythema. No pallor.  Psychiatric: Affect and judgment normal.  Breast: Surgical scars healing, mild erythema in both breasts around the surgical sites. Skin breakdown on the underside of bilateral breasts, right side worse than left.   LABORATORY DATA:  CBC    Component Value Date/Time   WBC 10.3  10/23/2016 1525   RBC 4.62 10/23/2016 1525   HGB 14.1 10/23/2016 1525   HCT 40.3 10/23/2016 1525   PLT 321 10/23/2016 1525   MCV 87.2 10/23/2016 1525   MCH 30.5 10/23/2016 1525   MCHC 35.0 10/23/2016 1525   RDW 12.6 10/23/2016 1525   LYMPHSABS 2.2 10/23/2016 1525   MONOABS 0.7 10/23/2016 1525   EOSABS 0.1 10/23/2016 1525   BASOSABS 0.0 10/23/2016 1525    CMP     Component Value Date/Time   NA 136 10/23/2016 1525   K 3.3 (L) 10/23/2016 1525   CL 100 (L) 10/23/2016 1525   CO2 28 10/23/2016 1525   GLUCOSE 94 10/23/2016 1525   BUN 6 10/23/2016 1525   CREATININE 0.71 10/23/2016 1525   CALCIUM 9.7 10/23/2016 1525   PROT 7.3 10/23/2016 1525   ALBUMIN 4.1 10/23/2016 1525   AST 21 10/23/2016 1525   ALT 27 10/23/2016 1525   ALKPHOS 79 10/23/2016 1525   BILITOT 0.5 10/23/2016 1525   GFRNONAA >60 10/23/2016 1525   GFRAA >60 10/23/2016 1525       PATHOLOGY:   Surgical Pathology Report Case: [id number]  Authorizing Provider:David Juanita Laster, MD Collected: 02/01/2017 0846 Ordering Location: [facility] [place] Received:02/01/2017 6808 Pathologist: Manley Mason,   MD  Specimens: A) - Breast, Right, RIGHT BREAST PALPATION GUIDED PARTIAL MASTECTOMY   B) - Breast, Right, RIGHT BREAST MEDIAL SEGMENT  C) - Lymph Node, RIGHT MATTED AXILLARY SENTINEL NODES  D) - Breast, Right, right breast tissue  E) - Breast, Left, left breast tissue   Final Diagnosis A: Breast, right, partial mastectomy - Invasive ductal carcinoma (see synoptic report) - Tumor size: 14 mm in greatest dimension - Ductal carcinoma in situ, grade 2, cribriform and solid types - Biopsy site changes present - Margin status (see final medial margin in specimen B)  Invasive carcinoma: Negative, 3 mm from the closest (medial) margin  Ductal carcinoma in situ: Negative; 0.6 mm from superior margin; other margins 2 mm or more - Ancillary studies on outside core biopsy [facility] [phone])              Estrogen receptor: Positive (95%)              Progesterone receptor: Positive (60%) HER2 FISH: Not amplified HER2/CEP17 ratio: 1.32 Mean HER2 copy number: 1.85  B: Breast, right, medial segment, excision - Negative for carcinoma  C: Right axilla, matted sentinel lymph node, excision - Five lymph nodes negative for carcinoma (0/5)  D: Right breast tissue, excision - Benign skin  and subcutaneous tissue  E: Left breast tissue, excision - Benign breast tissue and skin   Synoptic Report INVASIVE CARCINOMA OF THE BREAST(Breast Invasive - A)   SPECIMEN  Procedure:Excision (less than total mastectomy)   Specimen Laterality:Right   TUMOR  Clock Position of Tumor Site:5 o'clock   Histologic Type:Invasive carcinoma of no special type (ductal, not otherwise specified)   Histologic Grade (Nottingham Histologic Score):  Glandular (Acinar) / Tubular Differentiation:Score 2   Nuclear Pleomorphism:Score 2   Mitotic Rate:Score 2 (4-7 mitoses per mm2)   Overall Grade:Grade 2 (scores of 6 or 7)   Tumor Size:Greatest dimension of largest invasive focus in Millimeters (mm): 14 Millimeters (mm)  Tumor Focality:Single focus of invasive carcinoma   Ductal Carcinoma In Situ (DCIS):DCIS is present in specimen   Architectural Patterns:Cribriform   Architectural Patterns:Solid   Nuclear Grade:Grade II (intermediate)   Necrosis:Present, focal (small foci or single cell necrosis)  Skin:  Lymphovascular Invasion:Not identified   Dermal Lymphovascular Invasion:Not identified   Treatment Effect:No known presurgical therapy   MARGINS  Invasive Carcinoma Margins:Uninvolved by invasive carcinoma   Closest Margin:2 mm or more from all final margins (see negative extended medial margin in specimen B)   DCIS Margins:Uninvolved by DCIS   Distance of DCIS from Closest Margin in Millimeters (mm):0.6 Millimeters (mm)  Closest Margin:Superior   LYMPH NODES  Regional Lymph Nodes:  Number of Lymph Nodes with Macrometastases (> 2 mm):0   Number of Lymph Nodes with Micrometastases (> 0.2 mm to 2 mm and / or > 200 cells):0   Number of Lymph Nodes with Isolated Tumor Cells (<= 0.2 mm and  <= 200 cells):0   Number of Lymph Nodes Examined:5   Number of Sentinel Nodes Examined:5   PATHOLOGIC STAGE CLASSIFICATION (pTNM, AJCC 8th Edition)  Primary Tumor (Invasive Carcinoma) (pT):pT1c   Modifier:(sn): Only sentinel node(s) evaluated.If 6 or more nodes (sentinel or nonsentinel) are removed, this modifier should not be used.   Category (pN):pN0   Comment(s)  Comment(s):Representative tumor in A3         ASSESSMENT and THERAPY PLAN:  Cancer Staging Invasive ductal carcinoma of breast, female, right ([place]) Staging form: Breast, AJCC 8th Edition - Pathologic stage from 02/01/2017: Stage IA (pT1c, pN0(sn), cM0, G2, ER: Positive, PR: Positive, HER2: Negative, Oncotype DX score: 21) - Signed by Twana First, MD on 02/27/2017 - Clinical: cT1c, cN0, cM0, ER: Positive, PR: Positive, HER2: Negative - Signed by Twana First, MD on 02/27/2017  Reviewed patient's Oncotype DX with her in detail. She has a recurrent score of 21, therefore she will not need adjuvant chemotherapy. Patient has already started on tamoxifen. I will add Zoladex Q 28 days for ovarian suppression. We'll get her scheduled for her first dose of Zoladex next week. Prescribed gabapentin to aid with her hot flashes. Again went over in detail the side effects of tamoxifen. Follow up with radiation oncology a scheduled appointment tomorrow for adjuvant radiation. I have told her that she will likely not start radiation until after her healing issues have resolved. Return to clinic in 6 weeks for follow-up.  All questions were answered. The patient knows to call the clinic with any problems, questions or concerns. We can certainly see the patient much sooner if necessary. This note was electronically signed. Twana First, MD 02/27/2017

## 2017-03-06 ENCOUNTER — Ambulatory Visit: Admit: 2017-03-06 | Payer: 59 | Admitting: General Surgery

## 2017-03-06 ENCOUNTER — Encounter (HOSPITAL_COMMUNITY): Admission: RE | Admit: 2017-03-06 | Payer: 59 | Source: Ambulatory Visit

## 2017-03-06 SURGERY — BREAST LUMPECTOMY WITH RADIOACTIVE SEED AND SENTINEL LYMPH NODE BIOPSY
Anesthesia: General | Site: Breast | Laterality: Right

## 2017-03-07 ENCOUNTER — Ambulatory Visit (HOSPITAL_COMMUNITY): Payer: 59

## 2017-03-08 ENCOUNTER — Telehealth (HOSPITAL_COMMUNITY): Payer: Self-pay

## 2017-03-08 NOTE — Telephone Encounter (Signed)
See telephone encounter note.

## 2017-03-12 ENCOUNTER — Encounter (HOSPITAL_COMMUNITY): Payer: Self-pay

## 2017-03-12 ENCOUNTER — Encounter (HOSPITAL_COMMUNITY): Payer: 59 | Attending: Oncology

## 2017-03-12 VITALS — BP 134/91 | HR 102 | Temp 98.6°F | Resp 16

## 2017-03-12 DIAGNOSIS — Z5111 Encounter for antineoplastic chemotherapy: Secondary | ICD-10-CM | POA: Diagnosis not present

## 2017-03-12 DIAGNOSIS — C50311 Malignant neoplasm of lower-inner quadrant of right female breast: Secondary | ICD-10-CM | POA: Diagnosis not present

## 2017-03-12 DIAGNOSIS — C50911 Malignant neoplasm of unspecified site of right female breast: Secondary | ICD-10-CM

## 2017-03-12 MED ORDER — GOSERELIN ACETATE 3.6 MG ~~LOC~~ IMPL
3.6000 mg | DRUG_IMPLANT | Freq: Once | SUBCUTANEOUS | Status: AC
  Administered 2017-03-12: 3.6 mg via SUBCUTANEOUS
  Filled 2017-03-12: qty 3.6

## 2017-03-12 NOTE — Progress Notes (Signed)
Kylie Howard presents today for injection per the provider's orders.  Zoladex administration without incident; see MAR for injection details.  Patient tolerated procedure well and without incident.  No questions or complaints noted at this time.  Discharged ambulatory.  

## 2017-04-09 ENCOUNTER — Other Ambulatory Visit (HOSPITAL_COMMUNITY): Payer: Self-pay | Admitting: *Deleted

## 2017-04-09 DIAGNOSIS — C50911 Malignant neoplasm of unspecified site of right female breast: Secondary | ICD-10-CM

## 2017-04-10 ENCOUNTER — Encounter (HOSPITAL_COMMUNITY): Payer: Self-pay

## 2017-04-10 ENCOUNTER — Encounter (HOSPITAL_BASED_OUTPATIENT_CLINIC_OR_DEPARTMENT_OTHER): Payer: 59

## 2017-04-10 ENCOUNTER — Encounter (HOSPITAL_BASED_OUTPATIENT_CLINIC_OR_DEPARTMENT_OTHER): Payer: 59 | Admitting: Oncology

## 2017-04-10 ENCOUNTER — Encounter (HOSPITAL_COMMUNITY): Payer: 59 | Attending: Oncology

## 2017-04-10 VITALS — BP 156/97 | HR 100 | Resp 18 | Ht 66.0 in | Wt 195.7 lb

## 2017-04-10 DIAGNOSIS — E876 Hypokalemia: Secondary | ICD-10-CM

## 2017-04-10 DIAGNOSIS — Z170 Estrogen receptor positive status [ER+]: Secondary | ICD-10-CM

## 2017-04-10 DIAGNOSIS — Z79810 Long term (current) use of selective estrogen receptor modulators (SERMs): Secondary | ICD-10-CM | POA: Diagnosis not present

## 2017-04-10 DIAGNOSIS — C50911 Malignant neoplasm of unspecified site of right female breast: Secondary | ICD-10-CM

## 2017-04-10 DIAGNOSIS — Z5111 Encounter for antineoplastic chemotherapy: Secondary | ICD-10-CM | POA: Diagnosis not present

## 2017-04-10 DIAGNOSIS — C50311 Malignant neoplasm of lower-inner quadrant of right female breast: Secondary | ICD-10-CM

## 2017-04-10 DIAGNOSIS — N951 Menopausal and female climacteric states: Secondary | ICD-10-CM | POA: Diagnosis not present

## 2017-04-10 LAB — COMPREHENSIVE METABOLIC PANEL
ALK PHOS: 60 U/L (ref 38–126)
ALT: 34 U/L (ref 14–54)
AST: 28 U/L (ref 15–41)
Albumin: 3.8 g/dL (ref 3.5–5.0)
Anion gap: 9 (ref 5–15)
BUN: 8 mg/dL (ref 6–20)
CHLORIDE: 103 mmol/L (ref 101–111)
CO2: 27 mmol/L (ref 22–32)
CREATININE: 0.75 mg/dL (ref 0.44–1.00)
Calcium: 8.9 mg/dL (ref 8.9–10.3)
GFR calc Af Amer: >60 mL/min (ref >60)
GFR calc non Af Amer: >60 mL/min (ref >60)
GLUCOSE: 114 mg/dL — AB (ref 65–99)
Potassium: 3.2 mmol/L — ABNORMAL LOW (ref 3.5–5.1)
SODIUM: 139 mmol/L (ref 135–145)
Total Bilirubin: 0.3 mg/dL (ref 0.3–1.2)
Total Protein: 7 g/dL (ref 6.5–8.1)

## 2017-04-10 LAB — CBC WITH DIFFERENTIAL/PLATELET
BASOS ABS: 0 10*3/uL (ref 0.0–0.1)
Basophils Relative: 0 %
EOS ABS: 0.2 10*3/uL (ref 0.0–0.7)
EOS PCT: 2 %
HCT: 39.2 % (ref 36.0–46.0)
Hemoglobin: 13.3 g/dL (ref 12.0–15.0)
LYMPHS ABS: 2.1 10*3/uL (ref 0.7–4.0)
LYMPHS PCT: 27 %
MCH: 30 pg (ref 26.0–34.0)
MCHC: 33.9 g/dL (ref 30.0–36.0)
MCV: 88.3 fL (ref 78.0–100.0)
Monocytes Absolute: 0.5 10*3/uL (ref 0.1–1.0)
Monocytes Relative: 6 %
Neutro Abs: 4.9 10*3/uL (ref 1.7–7.7)
Neutrophils Relative %: 65 %
PLATELETS: 263 10*3/uL (ref 150–400)
RBC: 4.44 MIL/uL (ref 3.87–5.11)
RDW: 12.4 % (ref 11.5–15.5)
WBC: 7.6 10*3/uL (ref 4.0–10.5)

## 2017-04-10 MED ORDER — GOSERELIN ACETATE 3.6 MG ~~LOC~~ IMPL
3.6000 mg | DRUG_IMPLANT | Freq: Once | SUBCUTANEOUS | Status: AC
  Administered 2017-04-10: 3.6 mg via SUBCUTANEOUS
  Filled 2017-04-10: qty 3.6

## 2017-04-10 NOTE — Progress Notes (Signed)
Kylie Howard presents today for injection per the provider's orders.  Zoladex administration without incident; see MAR for injection details.  Patient tolerated procedure well and without incident.  No questions or complaints noted at this time.  Discharged ambulatory.

## 2017-04-10 NOTE — Progress Notes (Signed)
[facility] Follow up:    Kylie Bis, MD [place] [address]   DIAGNOSIS: Cancer Staging Invasive ductal carcinoma of breast, female, right [facility]) Staging form: Breast, AJCC 8th Edition - Pathologic stage from 02/01/2017: Stage IA (pT1c, pN0(sn), cM0, G2, ER: Positive, PR: Positive, HER2: Negative, Oncotype DX score: 21) - Signed by Twana First, MD on 02/27/2017 - Clinical: cT1c, cN0, cM0, ER: Positive, PR: Positive, HER2: Negative - Signed by Twana First, MD on 02/27/2017   SUMMARY OF ONCOLOGIC HISTORY:   Invasive ductal carcinoma of breast, female, right ([place])   10/10/2016 Procedure    Needle core biopsy of right breast 5 o'clock position      10/12/2016 Pathology Results    Invasive ductal carcinoma, grade 2 with DCIS.  ER 95% POSITIVE, PR 60% POSITIVE, Ki-67: 25%, HER-2 NEGATIVE.      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      02/01/2017 Surgery    Right partial mastectomy/oncoplastic reduction Final path: pT1c pN0 ER/PR +, HER2 -          02/13/2017 Oncotype testing    Recurrence score of 21     Kylie Howard is a   46 y.o. female who has been referred to [place] by Dr Gar Ponto ([facility]) for evaluation and management of newly diagnosed breast cancer.  Patient reports that she was in her usual state of health until mid February when she noted a gumball sized breast lump in bottom central part of her right breast. She notes that she notices this when she was having a bath.  Reports that her previous mammogram was in July 2015 and was negative.  Patient had a mammogram and ultrasound done on 09/25/2016 which showed a suspicious right breast mass at 5:00 position measuring 1.4 cm. 4 mm indeterminate nodule in the right breast at 10:00 position. No evidence of  right axillary lymphadenopathy. No evidence of left breast lesions.  Patient was set up for a needle core biopsy of her right breast mass at the 5:00 position. This was done on 10/10/2016 and showed invasive ductal carcinoma grade 2 and DCIS. Invasive ductal carcinoma was noted to be ER +95%, PR +60%,  HER-2 negative with a Ki-67 of 25%.  Patient reports that a biopsy of the right breast 10:00 lesion was attempted and this was thought to be a cyst.  Right partial mastectomy with reconstruction on 02/01/17. Her final pathology was pT1cN0 ER/PR +, HER2 -. She has done well after surgery however she continues to have wound healing issues on the underside of her breasts with open wounds.  INTERVAL HISTORY: Kylie Howard 46 y.o. female returns for continued follow-up. Since her last visit she states that most of her wounds have healed up. She has one area on her breast where there is still a stitch in place. She has not had any drainage from her surgical sites. She just went to see her plastic surgeon, Dr. Lynnell Jude yesterday. Patient has also seen rad-onc. It is still unclear when her start date would be for radiation. She has undergone simulation and a total of 5.5 weeks of radiation is planned. Dr. Lynnell Jude has stated that it was ok for her to proceed with radiation. Patient continues to take tamoxifen daily and zoladex monthly for ovarian suppression. She has hot flashes  which she states the gabapentin improves. She is only taking the gabapentin once a day since she states it makes her too somnolent. She also states she has been noting her mood is more labile. She also states her menstrual cycles are more delayed, occurring every 5-6 weeks instead of every 4 weeks.   Patient Active Problem List   Diagnosis Date Noted  . Genetic testing 12/10/2016  . Invasive ductal carcinoma of breast, female, right ([place]) 10/17/2016    is allergic to bee venom and wellbutrin [bupropion].  MEDICAL HISTORY: Past  Medical History:  Diagnosis Date  . Breast cancer ([place])    right  . Depression   . Invasive ductal carcinoma of breast, female, right ([place]) 10/17/2016  . Narcolepsy     SURGICAL HISTORY: Past Surgical History:  Procedure Laterality Date  . CESAREAN SECTION  2000,2005  . CHOLECYSTECTOMY      SOCIAL HISTORY: Social History   Social History  . Marital status: Married    Spouse name: N/A  . Number of children: N/A  . Years of education: N/A   Occupational History  . Not on file.   Social History Main Topics  . Smoking status: Never Smoker  . Smokeless tobacco: Never Used  . Alcohol use No  . Drug use: No  . Sexual activity: Yes   Other Topics Concern  . Not on file   Social History Narrative  . No narrative on file    FAMILY HISTORY: Family History  Problem Relation Age of Onset  . Cancer Maternal Grandmother 64       appendiceal with metastases d.72s  . Cancer Paternal Grandfather 74       laryngeal    Review of Systems  Constitutional: Negative for appetite change, chills, fatigue and fever.  HENT:   Negative for hearing loss, lump/mass, mouth sores, sore throat and tinnitus.   Eyes: Negative for eye problems and icterus.  Respiratory: Negative for chest tightness, cough, hemoptysis, shortness of breath and wheezing.   Cardiovascular: Negative for chest pain, leg swelling and palpitations.  Gastrointestinal: Negative for abdominal distention, abdominal pain, blood in stool, diarrhea, nausea and vomiting.  Endocrine: Positive for hot flashes.  Genitourinary: Negative for difficulty urinating, frequency and hematuria.   Musculoskeletal: Negative for arthralgias and neck pain.  Skin: Negative for itching and rash.  Neurological: Negative for dizziness, headaches and speech difficulty.  Hematological: Negative for adenopathy. Does not bruise/bleed easily.  Psychiatric/Behavioral: Negative for confusion. The patient is not nervous/anxious.       PHYSICAL  EXAMINATION  ECOG PERFORMANCE STATUS: 1 - Symptomatic but completely ambulatory  Vitals:   04/10/17 1046  BP: (!) 156/97  Pulse: 100  Resp: 18  SpO2: 98%    Physical Exam Constitutional: She is oriented to person, place, and time and well-developed, well-nourished, and in no distress. No distress.  HENT:  Head: Normocephalic and atraumatic.  Mouth/Throat: No oropharyngeal exudate.  Eyes: Pupils are equal, round, and reactive to light. Conjunctivae are normal. No scleral icterus.  Neck: Normal range of motion. Neck supple. No JVD present.  Cardiovascular: Normal rate, regular rhythm and normal heart sounds.  Exam reveals no gallop and no friction rub.   No murmur heard. Pulmonary/Chest: Effort normal and breath sounds normal. No respiratory distress. She has no wheezes. She has no rales.  Abdominal: Soft. Bowel sounds are normal. She exhibits no distension. There is no tenderness. There is no guarding.  Musculoskeletal: She exhibits no edema or tenderness.  Lymphadenopathy:  She has no cervical adenopathy.  Neurological: She is alert and oriented to person, place, and time. No cranial nerve deficit.  Skin: Skin is warm and dry. No rash noted. No erythema. No pallor.  Psychiatric: Affect and judgment normal.  Breast: Surgical scars healing, mild erythema in both breasts around the surgical sites. Skin breakdown on the underside of bilateral breasts, right side worse than left.   LABORATORY DATA:  CBC    Component Value Date/Time   WBC 7.6 04/10/2017 1014   RBC 4.44 04/10/2017 1014   HGB 13.3 04/10/2017 1014   HCT 39.2 04/10/2017 1014   PLT 263 04/10/2017 1014   MCV 88.3 04/10/2017 1014   MCH 30.0 04/10/2017 1014   MCHC 33.9 04/10/2017 1014   RDW 12.4 04/10/2017 1014   LYMPHSABS 2.1 04/10/2017 1014   MONOABS 0.5 04/10/2017 1014   EOSABS 0.2 04/10/2017 1014   BASOSABS 0.0 04/10/2017 1014    CMP     Component Value Date/Time   NA 139 04/10/2017 1014   K 3.2 (L)  04/10/2017 1014   CL 103 04/10/2017 1014   CO2 27 04/10/2017 1014   GLUCOSE 114 (H) 04/10/2017 1014   BUN 8 04/10/2017 1014   CREATININE 0.75 04/10/2017 1014   CALCIUM 8.9 04/10/2017 1014   PROT 7.0 04/10/2017 1014   ALBUMIN 3.8 04/10/2017 1014   AST 28 04/10/2017 1014   ALT 34 04/10/2017 1014   ALKPHOS 60 04/10/2017 1014   BILITOT 0.3 04/10/2017 1014   GFRNONAA >60 04/10/2017 1014   GFRAA >60 04/10/2017 1014       PATHOLOGY:  Surgical Pathology Report Case: [id number]  Authorizing Provider:David Juanita Laster, MD Collected: 02/01/2017 0846 Ordering Location: [place] [facility] [place] Received:02/01/2017 8546 Pathologist: Manley Mason,   MD  Specimens: A) - Breast, Right, RIGHT BREAST PALPATION GUIDED PARTIAL MASTECTOMY   B) - Breast, Right, RIGHT BREAST MEDIAL SEGMENT  C) - Lymph Node, RIGHT MATTED AXILLARY SENTINEL NODES  D) - Breast, Right, right breast tissue  E) - Breast, Left, left breast tissue   Final Diagnosis A: Breast, right, partial mastectomy - Invasive ductal carcinoma (see synoptic report) - Tumor size: 14 mm in greatest dimension - Ductal carcinoma in situ, grade 2, cribriform and solid types - Biopsy site changes present - Margin status (see final medial margin in specimen B)  Invasive carcinoma: Negative, 3 mm from the closest (medial) margin  Ductal carcinoma in situ: Negative; 0.6 mm from superior margin; other margins 2 mm or more - Ancillary studies on  outside core biopsy [facility] [phone])              Estrogen receptor: Positive (95%)              Progesterone receptor: Positive (60%) HER2 FISH: Not amplified HER2/CEP17 ratio: 1.32 Mean HER2 copy number: 1.85  B: Breast, right, medial segment, excision - Negative for carcinoma  C: Right axilla, matted sentinel lymph node, excision - Five lymph nodes negative for carcinoma (0/5)  D: Right breast tissue, excision - Benign skin and subcutaneous tissue  E: Left breast tissue, excision - Benign breast tissue and skin   Synoptic Report INVASIVE CARCINOMA OF THE BREAST(Breast Invasive - A)   SPECIMEN  Procedure:Excision (less than total mastectomy)   Specimen Laterality:Right   TUMOR  Clock Position of Tumor Site:5 o'clock   Histologic Type:Invasive carcinoma of no special type (ductal, not otherwise specified)   Histologic Grade (Nottingham Histologic Score):  Glandular (Acinar) / Tubular Differentiation:Score  2   Nuclear Pleomorphism:Score 2   Mitotic Rate:Score 2 (4-7 mitoses per mm2)   Overall Grade:Grade 2 (scores of 6 or 7)   Tumor Size:Greatest dimension of largest invasive focus in Millimeters (mm): 14 Millimeters (mm)  Tumor Focality:Single focus of invasive carcinoma   Ductal Carcinoma In Situ (DCIS):DCIS is present in specimen   Architectural Patterns:Cribriform   Architectural Patterns:Solid   Nuclear Grade:Grade II (intermediate)   Necrosis:Present, focal (small foci or single cell necrosis)   Skin:  Lymphovascular Invasion:Not identified   Dermal Lymphovascular Invasion:Not identified   Treatment Effect:No known presurgical therapy   MARGINS  Invasive Carcinoma Margins:Uninvolved by invasive carcinoma   Closest Margin:2 mm or more from all final margins (see negative extended medial margin  in specimen B)   DCIS Margins:Uninvolved by DCIS   Distance of DCIS from Closest Margin in Millimeters (mm):0.6 Millimeters (mm)  Closest Margin:Superior   LYMPH NODES  Regional Lymph Nodes:  Number of Lymph Nodes with Macrometastases (> 2 mm):0   Number of Lymph Nodes with Micrometastases (> 0.2 mm to 2 mm and / or > 200 cells):0   Number of Lymph Nodes with Isolated Tumor Cells (<= 0.2 mm and <= 200 cells):0   Number of Lymph Nodes Examined:5   Number of Sentinel Nodes Examined:5   PATHOLOGIC STAGE CLASSIFICATION (pTNM, AJCC 8th Edition)  Primary Tumor (Invasive Carcinoma) (pT):pT1c   Modifier:(sn): Only sentinel node(s) evaluated.If 6 or more nodes (sentinel or nonsentinel) are removed, this modifier should not be used.   Category (pN):pN0   Comment(s)  Comment(s):Representative tumor in A3         ASSESSMENT and THERAPY PLAN:  Cancer Staging Invasive ductal carcinoma of breast, female, right ([place]) Staging form: Breast, AJCC 8th Edition - Pathologic stage from 02/01/2017: Stage IA (pT1c, pN0(sn), cM0, G2, ER: Positive, PR: Positive, HER2: Negative, Oncotype DX score: 21) - Signed by Twana First, MD on 02/27/2017 - Clinical: cT1c, cN0, cM0, ER: Positive, PR: Positive, HER2: Negative - Signed by Twana First, MD on 02/27/2017  -Continue daily tamoxifen and monthly zoladex for ovarian suppression. She will get her zoladex today. - Reviewed her labs with her today. Mild hypokalemia with K 3.2. Advised patient to eat a few bananas this week.  -Advised patient to take her gabapentin qhs since she has hot flashes the worst at night. -Proceed with adjuvant radiation as per rad-onc. -RTC in 3 months for follow up with labs.  Orders Placed This Encounter  Procedures  . CBC with Differential    Standing Status:   Future    Standing Expiration Date:   04/10/2018  . Comprehensive metabolic  panel    Standing Status:   Future    Standing Expiration Date:   04/10/2018    All questions were answered. The patient knows to call the clinic with any problems, questions or concerns. We can certainly see the patient much sooner if necessary.  This note was electronically signed. Twana First, MD 04/10/2017

## 2017-04-11 ENCOUNTER — Ambulatory Visit (HOSPITAL_COMMUNITY): Payer: 59

## 2017-04-11 ENCOUNTER — Other Ambulatory Visit (HOSPITAL_COMMUNITY): Payer: 59

## 2017-05-08 ENCOUNTER — Encounter (HOSPITAL_COMMUNITY): Payer: 59 | Attending: Oncology

## 2017-05-08 ENCOUNTER — Encounter (HOSPITAL_COMMUNITY): Payer: Self-pay

## 2017-05-08 VITALS — BP 136/89 | HR 102 | Temp 98.4°F | Resp 18

## 2017-05-08 DIAGNOSIS — Z23 Encounter for immunization: Secondary | ICD-10-CM | POA: Diagnosis not present

## 2017-05-08 DIAGNOSIS — C50311 Malignant neoplasm of lower-inner quadrant of right female breast: Secondary | ICD-10-CM

## 2017-05-08 DIAGNOSIS — Z5111 Encounter for antineoplastic chemotherapy: Secondary | ICD-10-CM

## 2017-05-08 DIAGNOSIS — C50911 Malignant neoplasm of unspecified site of right female breast: Secondary | ICD-10-CM

## 2017-05-08 MED ORDER — GOSERELIN ACETATE 3.6 MG ~~LOC~~ IMPL
3.6000 mg | DRUG_IMPLANT | Freq: Once | SUBCUTANEOUS | Status: AC
  Administered 2017-05-08: 3.6 mg via SUBCUTANEOUS
  Filled 2017-05-08: qty 3.6

## 2017-05-08 MED ORDER — INFLUENZA VAC SPLIT QUAD 0.5 ML IM SUSY
0.5000 mL | PREFILLED_SYRINGE | Freq: Once | INTRAMUSCULAR | Status: AC
  Administered 2017-05-08: 0.5 mL via INTRAMUSCULAR
  Filled 2017-05-08: qty 0.5

## 2017-05-08 NOTE — Progress Notes (Signed)
Kylie Howard presents today for injection per the provider's orders.  Zoladex and Fluarix administrations without incident; see MAR for injection details.  Patient tolerated procedure well and without incident.  No questions or complaints noted at this time.  Discharged ambulatory.

## 2017-06-05 ENCOUNTER — Ambulatory Visit (HOSPITAL_COMMUNITY): Payer: 59

## 2017-07-03 ENCOUNTER — Encounter (HOSPITAL_COMMUNITY): Payer: 59 | Attending: Oncology | Admitting: Oncology

## 2017-07-03 ENCOUNTER — Other Ambulatory Visit: Payer: Self-pay

## 2017-07-03 ENCOUNTER — Encounter (HOSPITAL_COMMUNITY): Payer: 59 | Attending: Oncology

## 2017-07-03 ENCOUNTER — Encounter (HOSPITAL_COMMUNITY): Payer: Self-pay | Admitting: Oncology

## 2017-07-03 ENCOUNTER — Encounter (HOSPITAL_BASED_OUTPATIENT_CLINIC_OR_DEPARTMENT_OTHER): Payer: 59

## 2017-07-03 VITALS — BP 143/100 | HR 95 | Temp 98.3°F | Resp 18 | Ht 66.0 in | Wt 193.6 lb

## 2017-07-03 DIAGNOSIS — C50911 Malignant neoplasm of unspecified site of right female breast: Secondary | ICD-10-CM | POA: Insufficient documentation

## 2017-07-03 DIAGNOSIS — N951 Menopausal and female climacteric states: Secondary | ICD-10-CM

## 2017-07-03 DIAGNOSIS — Z5111 Encounter for antineoplastic chemotherapy: Secondary | ICD-10-CM

## 2017-07-03 DIAGNOSIS — R51 Headache: Secondary | ICD-10-CM

## 2017-07-03 LAB — COMPREHENSIVE METABOLIC PANEL
ALBUMIN: 3.9 g/dL (ref 3.5–5.0)
ALT: 37 U/L (ref 14–54)
AST: 29 U/L (ref 15–41)
Alkaline Phosphatase: 78 U/L (ref 38–126)
Anion gap: 6 (ref 5–15)
BUN: 8 mg/dL (ref 6–20)
CO2: 28 mmol/L (ref 22–32)
CREATININE: 0.69 mg/dL (ref 0.44–1.00)
Calcium: 8.9 mg/dL (ref 8.9–10.3)
Chloride: 103 mmol/L (ref 101–111)
GFR calc Af Amer: >60 mL/min (ref >60)
GLUCOSE: 115 mg/dL — AB (ref 65–99)
Potassium: 3.4 mmol/L — ABNORMAL LOW (ref 3.5–5.1)
SODIUM: 137 mmol/L (ref 135–145)
Total Bilirubin: 0.7 mg/dL (ref 0.3–1.2)
Total Protein: 7.2 g/dL (ref 6.5–8.1)

## 2017-07-03 LAB — CBC WITH DIFFERENTIAL/PLATELET
BASOS ABS: 0 10*3/uL (ref 0.0–0.1)
Basophils Relative: 0 %
EOS PCT: 1 %
Eosinophils Absolute: 0.1 10*3/uL (ref 0.0–0.7)
HCT: 42 % (ref 36.0–46.0)
Hemoglobin: 13.8 g/dL (ref 12.0–15.0)
Lymphocytes Relative: 14 %
Lymphs Abs: 1 10*3/uL (ref 0.7–4.0)
MCH: 29.4 pg (ref 26.0–34.0)
MCHC: 32.9 g/dL (ref 30.0–36.0)
MCV: 89.6 fL (ref 78.0–100.0)
Monocytes Absolute: 0.5 10*3/uL (ref 0.1–1.0)
Monocytes Relative: 7 %
Neutro Abs: 5.4 10*3/uL (ref 1.7–7.7)
Neutrophils Relative %: 78 %
PLATELETS: 255 10*3/uL (ref 150–400)
RBC: 4.69 MIL/uL (ref 3.87–5.11)
RDW: 13.2 % (ref 11.5–15.5)
WBC: 7 10*3/uL (ref 4.0–10.5)

## 2017-07-03 MED ORDER — GOSERELIN ACETATE 3.6 MG ~~LOC~~ IMPL
3.6000 mg | DRUG_IMPLANT | Freq: Once | SUBCUTANEOUS | Status: AC
  Administered 2017-07-03: 3.6 mg via SUBCUTANEOUS
  Filled 2017-07-03: qty 3.6

## 2017-07-03 NOTE — Progress Notes (Signed)
Kylie Howard tolerated Zoladex injection well without complaints or incident.Pt discharged self ambulatory in satisfactory condition

## 2017-07-03 NOTE — Progress Notes (Signed)
[facility] Follow up:    Caryl Bis, MD [place] [address]   DIAGNOSIS: Cancer Staging Invasive ductal carcinoma of breast, female, right [facility]) Staging form: Breast, AJCC 8th Edition - Pathologic stage from 02/01/2017: Stage IA (pT1c, pN0(sn), cM0, G2, ER: Positive, PR: Positive, HER2: Negative, Oncotype DX score: 21) - Signed by Twana First, MD on 02/27/2017 - Clinical: cT1c, cN0, cM0, ER: Positive, PR: Positive, HER2: Negative - Signed by Twana First, MD on 02/27/2017   SUMMARY OF ONCOLOGIC HISTORY:   Invasive ductal carcinoma of breast, female, right (Kylie Howard)   10/10/2016 Procedure    Needle core biopsy of right breast 5 o'clock position      10/12/2016 Pathology Results    Invasive ductal carcinoma, grade 2 with DCIS.  ER 95% POSITIVE, PR 60% POSITIVE, Ki-67: 25%, HER-2 NEGATIVE.      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      02/01/2017 Surgery    Right partial mastectomy/oncoplastic reduction Final path: pT1c pN0 ER/PR +, HER2 -          02/13/2017 Oncotype testing    Recurrence score of 21      04/11/2017 - 05/29/2017 Radiation Therapy     Total of 29 treatments given at [facility].     Kylie Howard is a   46 y.o. female who has been referred to [place] by Dr Gar Ponto ([facility]) for evaluation and management of newly diagnosed breast cancer.  Patient reports that she was in her usual state of health until mid February when she noted a gumball sized breast lump in bottom central part of her right breast. She notes that she notices this when she was having a bath.  Reports that her previous mammogram was in July 2015 and was negative.  Patient had a mammogram and ultrasound done on 09/25/2016 which showed a suspicious right breast mass at 5:00 position  measuring 1.4 cm. 4 mm indeterminate nodule in the right breast at 10:00 position. No evidence of right axillary lymphadenopathy. No evidence of left breast lesions.  Patient was set up for a needle core biopsy of her right breast mass at the 5:00 position. This was done on 10/10/2016 and showed invasive ductal carcinoma grade 2 and DCIS. Invasive ductal carcinoma was noted to be ER +95%, PR +60%,  HER-2 negative with a Ki-67 of 25%.  Patient reports that a biopsy of the right breast 10:00 lesion was attempted and this was thought to be a cyst.  Right partial mastectomy with reconstruction on 02/01/17. Her final pathology was pT1cN0 ER/PR +, HER2 -. She has done well after surgery however she continues to have wound healing issues on the underside of her breasts with open wounds.  INTERVAL HISTORY: Patient presents today for continued follow-up.  She states she has been doing well.  She completed her adjuvant radiation from 04/11/17 through 05/29/17 for a total of 29 treatments.  She tolerated radiation well without any problems.  She has been taking her tamoxifen and states she has hot flashes from tamoxifen.  She has gabapentin nightly which was prescribed for her hot flashes, however patient states that she keeps forgetting to take it.  She also gets monthly Zoladex for ovarian suppression.  She has chronic migraines, however has been having new headaches in the base of  her skull which is dull in nature for the past few weeks.  She denies any chest pain, shortness of breath, abdominal pain, nausea, vomiting, diarrhea, focal weakness, blurred vision.   Patient Active Problem List   Diagnosis Date Noted  . Genetic testing 12/10/2016  . Invasive ductal carcinoma of breast, female, right (Kylie Howard) 10/17/2016    is allergic to bee venom and wellbutrin [bupropion].  MEDICAL HISTORY: Past Medical History:  Diagnosis Date  . Breast cancer ([place])    right  . Depression   . Invasive ductal carcinoma of  breast, female, right ([place]) 10/17/2016  . Narcolepsy     SURGICAL HISTORY: Past Surgical History:  Procedure Laterality Date  . CESAREAN SECTION  2000,2005  . CHOLECYSTECTOMY      SOCIAL HISTORY: Social History   Socioeconomic History  . Marital status: Married    Spouse name: Not on file  . Number of children: Not on file  . Years of education: Not on file  . Highest education level: Not on file  Social Needs  . Financial resource strain: Not on file  . Food insecurity - worry: Not on file  . Food insecurity - inability: Not on file  . Transportation needs - medical: Not on file  . Transportation needs - non-medical: Not on file  Occupational History  . Not on file  Tobacco Use  . Smoking status: Never Smoker  . Smokeless tobacco: Never Used  Substance and Sexual Activity  . Alcohol use: No  . Drug use: No  . Sexual activity: Yes  Other Topics Concern  . Not on file  Social History Narrative  . Not on file    FAMILY HISTORY: Family History  Problem Relation Age of Onset  . Cancer Maternal Grandmother 57       appendiceal with metastases d.72s  . Cancer Paternal Grandfather 61       laryngeal    Review of Systems  Constitutional: Negative for appetite change, chills, fatigue and fever.  HENT:   Negative for hearing loss, lump/mass, mouth sores, sore throat and tinnitus.   Eyes: Negative for eye problems and icterus.  Respiratory: Negative for chest tightness, cough, hemoptysis, shortness of breath and wheezing.   Cardiovascular: Negative for chest pain, leg swelling and palpitations.  Gastrointestinal: Negative for abdominal distention, abdominal pain, blood in stool, diarrhea, nausea and vomiting.  Endocrine: Positive for hot flashes.  Genitourinary: Negative for difficulty urinating, frequency and hematuria.   Musculoskeletal: Negative for arthralgias and neck pain.  Skin: Negative for itching and rash.  Neurological: Negative for dizziness, headaches and  speech difficulty.  Hematological: Negative for adenopathy. Does not bruise/bleed easily.  Psychiatric/Behavioral: Negative for confusion. The patient is not nervous/anxious.       PHYSICAL EXAMINATION  ECOG PERFORMANCE STATUS: 1 - Symptomatic but completely ambulatory  Vitals:   07/03/17 1018  BP: (!) 143/100  Pulse: 95  Resp: 18  Temp: 98.3 F (36.8 C)  SpO2: 99%    Physical Exam Constitutional: She is oriented to person, place, and time and well-developed, well-nourished, and in no distress. No distress.  HENT:  Head: Normocephalic and atraumatic.  Mouth/Throat: No oropharyngeal exudate.  Eyes: Pupils are equal, round, and reactive to light. Conjunctivae are normal. No scleral icterus.  Neck: Normal range of motion. Neck supple. No JVD present.  Cardiovascular: Normal rate, regular rhythm and normal heart sounds.  Exam reveals no gallop and no friction rub.   No murmur heard. Pulmonary/Chest: Effort normal and breath  sounds normal. No respiratory distress. She has no wheezes. She has no rales.  Abdominal: Soft. Bowel sounds are normal. She exhibits no distension. There is no tenderness. There is no guarding.  Musculoskeletal: She exhibits no edema or tenderness.  Lymphadenopathy:    She has no cervical adenopathy.  Neurological: She is alert and oriented to person, place, and time. No cranial nerve deficit.  Skin: Skin is warm and dry. No rash noted. No erythema. No pallor.  Psychiatric: Affect and judgment normal.    LABORATORY DATA:  CBC    Component Value Date/Time   WBC 7.0 07/03/2017 0944   RBC 4.69 07/03/2017 0944   HGB 13.8 07/03/2017 0944   HCT 42.0 07/03/2017 0944   PLT 255 07/03/2017 0944   MCV 89.6 07/03/2017 0944   MCH 29.4 07/03/2017 0944   MCHC 32.9 07/03/2017 0944   RDW 13.2 07/03/2017 0944   LYMPHSABS 1.0 07/03/2017 0944   MONOABS 0.5 07/03/2017 0944   EOSABS 0.1 07/03/2017 0944   BASOSABS 0.0 07/03/2017 0944    CMP     Component Value  Date/Time   NA 137 07/03/2017 0944   K 3.4 (L) 07/03/2017 0944   CL 103 07/03/2017 0944   CO2 28 07/03/2017 0944   GLUCOSE 115 (H) 07/03/2017 0944   BUN 8 07/03/2017 0944   CREATININE 0.69 07/03/2017 0944   CALCIUM 8.9 07/03/2017 0944   PROT 7.2 07/03/2017 0944   ALBUMIN 3.9 07/03/2017 0944   AST 29 07/03/2017 0944   ALT 37 07/03/2017 0944   ALKPHOS 78 07/03/2017 0944   BILITOT 0.7 07/03/2017 0944   GFRNONAA >60 07/03/2017 0944   GFRAA >60 07/03/2017 0944       PATHOLOGY:  Surgical Pathology Report Case: [id number]  Authorizing Provider:David Juanita Laster, MD Collected: 02/01/2017 0846 Ordering Location: [facility] [place] Received:02/01/2017 1856 Pathologist: Manley Mason,   MD  Specimens: A) - Breast, Right, RIGHT BREAST PALPATION GUIDED PARTIAL MASTECTOMY   B) - Breast, Right, RIGHT BREAST MEDIAL SEGMENT  C) - Lymph Node, RIGHT MATTED AXILLARY SENTINEL NODES  D) - Breast, Right, right breast tissue  E) - Breast, Left, left breast tissue   Final Diagnosis A: Breast, right, partial mastectomy - Invasive ductal carcinoma (see synoptic report) - Tumor size: 14 mm in greatest dimension - Ductal carcinoma in situ, grade 2, cribriform and solid types - Biopsy site changes present - Margin status (see final medial margin in specimen B)  Invasive carcinoma: Negative, 3 mm from the closest (medial) margin  Ductal carcinoma in situ: Negative; 0.6 mm from superior margin; other  margins 2 mm or more - Ancillary studies on outside core biopsy [employer] [id number])              Estrogen receptor: Positive (95%)              Progesterone receptor: Positive (60%) HER2 FISH: Not amplified HER2/CEP17 ratio: 1.32 Mean HER2 copy number: 1.85  B: Breast, right, medial segment, excision - Negative for carcinoma  C: Right axilla, matted sentinel lymph node, excision - Five lymph nodes negative for carcinoma (0/5)  D: Right breast tissue, excision - Benign skin and subcutaneous tissue  E: Left breast tissue, excision - Benign breast tissue and skin   Synoptic Report INVASIVE CARCINOMA OF THE BREAST(Breast Invasive - A)   SPECIMEN  Procedure:Excision (less than total mastectomy)   Specimen Laterality:Right   TUMOR  Clock Position of Tumor Site:5 o'clock   Histologic Type:Invasive carcinoma of no  special type (ductal, not otherwise specified)   Histologic Grade (Nottingham Histologic Score):  Glandular (Acinar) / Tubular Differentiation:Score 2   Nuclear Pleomorphism:Score 2   Mitotic Rate:Score 2 (4-7 mitoses per mm2)   Overall Grade:Grade 2 (scores of 6 or 7)   Tumor Size:Greatest dimension of largest invasive focus in Millimeters (mm): 14 Millimeters (mm)  Tumor Focality:Single focus of invasive carcinoma   Ductal Carcinoma In Situ (DCIS):DCIS is present in specimen   Architectural Patterns:Cribriform   Architectural Patterns:Solid   Nuclear Grade:Grade II (intermediate)   Necrosis:Present, focal (small foci or single cell necrosis)   Skin:  Lymphovascular Invasion:Not identified   Dermal Lymphovascular Invasion:Not identified   Treatment Effect:No known presurgical therapy   MARGINS  Invasive Carcinoma Margins:Uninvolved by invasive carcinoma   Closest Margin:2 mm or more from all final  margins (see negative extended medial margin in specimen B)   DCIS Margins:Uninvolved by DCIS   Distance of DCIS from Closest Margin in Millimeters (mm):0.6 Millimeters (mm)  Closest Margin:Superior   LYMPH NODES  Regional Lymph Nodes:  Number of Lymph Nodes with Macrometastases (> 2 mm):0   Number of Lymph Nodes with Micrometastases (> 0.2 mm to 2 mm and / or > 200 cells):0   Number of Lymph Nodes with Isolated Tumor Cells (<= 0.2 mm and <= 200 cells):0   Number of Lymph Nodes Examined:5   Number of Sentinel Nodes Examined:5   PATHOLOGIC STAGE CLASSIFICATION (pTNM, AJCC 8th Edition)  Primary Tumor (Invasive Carcinoma) (pT):pT1c   Modifier:(sn): Only sentinel node(s) evaluated.If 6 or more nodes (sentinel or nonsentinel) are removed, this modifier should not be used.   Category (pN):pN0   Comment(s)  Comment(s):Representative tumor in A3         ASSESSMENT and THERAPY PLAN:  Cancer Staging Invasive ductal carcinoma of breast, female, right ([place]) Staging form: Breast, AJCC 8th Edition - Pathologic stage from 02/01/2017: Stage IA (pT1c, pN0(sn), cM0, G2, ER: Positive, PR: Positive, HER2: Negative, Oncotype DX score: 21) - Signed by Twana First, MD on 02/27/2017 - Clinical: cT1c, cN0, cM0, ER: Positive, PR: Positive, HER2: Negative - Signed by Twana First, MD on 02/27/2017  -Continue daily tamoxifen and monthly zoladex for ovarian suppression. She will get her zoladex today. -Reviewed her labs with her today. -Advised patient to take her gabapentin qhs since she has hot flashes the worst at night. -Breast exam on her next visit.  -She will get her 6 month diagnostic mammogram in April 2019. Will order on her next visit. -RTC in 3 months for follow up with labs.  Orders Placed This Encounter  Procedures  . CBC with Differential    Standing Status:   Future    Standing Expiration  Date:   07/03/2018  . Comprehensive metabolic panel    Standing Status:   Future    Standing Expiration Date:   07/03/2018    All questions were answered. The patient knows to call the clinic with any problems, questions or concerns. We can certainly see the patient much sooner if necessary.  This note was electronically signed. Twana First, MD 07/03/2017

## 2017-07-03 NOTE — Patient Instructions (Signed)
[place] at [facility] Discharge Instructions  RECOMMENDATIONS MADE BY THE CONSULTANT AND ANY TEST RESULTS WILL BE SENT TO YOUR REFERRING PHYSICIAN.  Received Zoladex injection today. Follow-up as scheduled. Call clinic for any questions or concerns  Thank you for choosing [place] at [facility] to provide your oncology and hematology care.  To afford each patient quality time with our provider, please arrive at least 15 minutes before your scheduled appointment time.    If you have a lab appointment with the [facility] please come in thru the  Main Entrance and check in at the main information desk  You need to re-schedule your appointment should you arrive 10 or more minutes late.  We strive to give you quality time with our providers, and arriving late affects you and other patients whose appointments are after yours.  Also, if you no show three or more times for appointments you may be dismissed from the clinic at the providers discretion.     Again, thank you for choosing [facility].  Our hope is that these requests will decrease the amount of time that you wait before being seen by our physicians.       _____________________________________________________________  Should you have questions after your visit to [facility], please contact our office at [phone] between the hours of 8:30 a.m. and 4:30 p.m.  Voicemails left after 4:30 p.m. will not be returned until the following business day.  For prescription refill requests, have your pharmacy contact our office.       Resources For Cancer Patients and their Caregivers ? American Cancer Society: Can assist with transportation, wigs, general needs, runs Look Good Feel Better.        ([phone] ? Cancer Care: Provides financial assistance, online support groups, medication/co-pay assistance.  1-800-813-HOPE 567-370-7270) ? [place] Assists [place] cancer patients and their families through emotional , educational and financial support.  908-384-4966 ? [place] DSS Where to apply for food stamps, Medicaid and utility assistance. (418) 100-3088 ? RCATS: Transportation to medical appointments. (740) 179-0492 ? Social Security Administration: May apply for disability if have a Stage IV cancer. 470-794-5784 (504) 204-0874 ? [employer], Disability and Transit Services: Assists with nutrition, care and transit needs. Many Support Programs: @10RELATIVEDAYS @ > Cancer Support Group  2nd Tuesday of the month 1pm-2pm, Journey Room  > Creative Journey  3rd Tuesday of the month 1130am-1pm, Journey Room  > Look Good Feel Better  1st Wednesday of the month 10am-12 noon, Journey Room (Call [place] to register [phone])

## 2017-08-02 ENCOUNTER — Ambulatory Visit (HOSPITAL_COMMUNITY): Payer: 59

## 2017-09-02 ENCOUNTER — Inpatient Hospital Stay (HOSPITAL_COMMUNITY): Payer: BLUE CROSS/BLUE SHIELD | Attending: Internal Medicine

## 2017-09-02 ENCOUNTER — Encounter (HOSPITAL_COMMUNITY): Payer: Self-pay

## 2017-09-02 VITALS — BP 135/96 | HR 105 | Temp 98.7°F | Resp 20

## 2017-09-02 DIAGNOSIS — C50811 Malignant neoplasm of overlapping sites of right female breast: Secondary | ICD-10-CM | POA: Diagnosis not present

## 2017-09-02 DIAGNOSIS — Z170 Estrogen receptor positive status [ER+]: Secondary | ICD-10-CM | POA: Diagnosis not present

## 2017-09-02 DIAGNOSIS — Z79818 Long term (current) use of other agents affecting estrogen receptors and estrogen levels: Secondary | ICD-10-CM | POA: Insufficient documentation

## 2017-09-02 DIAGNOSIS — C50911 Malignant neoplasm of unspecified site of right female breast: Secondary | ICD-10-CM

## 2017-09-02 MED ORDER — GOSERELIN ACETATE 3.6 MG ~~LOC~~ IMPL
3.6000 mg | DRUG_IMPLANT | Freq: Once | SUBCUTANEOUS | Status: AC
  Administered 2017-09-02: 3.6 mg via SUBCUTANEOUS
  Filled 2017-09-02: qty 3.6

## 2017-09-02 NOTE — Patient Instructions (Signed)
[place] at [facility]  Discharge Instructions:  You received zoladex today.  Call for any questions or concerns.  _______________________________________________________________  Thank you for choosing [place] at [facility] to provide your oncology and hematology care.  To afford each patient quality time with our providers, please arrive at least 15 minutes before your scheduled appointment.  You need to re-schedule your appointment if you arrive 10 or more minutes late.  We strive to give you quality time with our providers, and arriving late affects you and other patients whose appointments are after yours.  Also, if you no show three or more times for appointments you may be dismissed from the clinic.  Again, thank you for choosing [place] at [place] hope is that these requests will allow you access to exceptional care and in a timely manner. _______________________________________________________________  If you have questions after your visit, please contact our office at [phone] between the hours of 8:30 a.m. and 5:00 p.m. Voicemails left after 4:30 p.m. will not be returned until the following business day. _______________________________________________________________  For prescription refill requests, have your pharmacy contact our office. _______________________________________________________________  Recommendations made by the consultant and any test results will be sent to your referring physician. _______________________________________________________________

## 2017-09-02 NOTE — Progress Notes (Signed)
To treatment area for zoladex shot.  No complaints voiced.  Patient missed December shot because she forgot per patient.    Patient tolerated injection with no complaints voiced.  Site clean and dry with no bruising or swelling noted at site.  No bleeding noted.  Band aid applied.  VSS with discharge and left ambulatory with no s/s of distress noted.

## 2017-09-26 DIAGNOSIS — G47419 Narcolepsy without cataplexy: Secondary | ICD-10-CM | POA: Diagnosis not present

## 2017-09-26 DIAGNOSIS — E8881 Metabolic syndrome: Secondary | ICD-10-CM | POA: Diagnosis not present

## 2017-09-26 DIAGNOSIS — F900 Attention-deficit hyperactivity disorder, predominantly inattentive type: Secondary | ICD-10-CM | POA: Diagnosis not present

## 2017-09-26 DIAGNOSIS — F331 Major depressive disorder, recurrent, moderate: Secondary | ICD-10-CM | POA: Diagnosis not present

## 2017-10-02 ENCOUNTER — Other Ambulatory Visit (HOSPITAL_COMMUNITY): Payer: Self-pay | Admitting: *Deleted

## 2017-10-02 DIAGNOSIS — F900 Attention-deficit hyperactivity disorder, predominantly inattentive type: Secondary | ICD-10-CM | POA: Diagnosis not present

## 2017-10-02 DIAGNOSIS — G47419 Narcolepsy without cataplexy: Secondary | ICD-10-CM | POA: Diagnosis not present

## 2017-10-02 DIAGNOSIS — C50911 Malignant neoplasm of unspecified site of right female breast: Secondary | ICD-10-CM

## 2017-10-02 DIAGNOSIS — E8881 Metabolic syndrome: Secondary | ICD-10-CM | POA: Diagnosis not present

## 2017-10-02 DIAGNOSIS — F331 Major depressive disorder, recurrent, moderate: Secondary | ICD-10-CM | POA: Diagnosis not present

## 2017-10-03 ENCOUNTER — Inpatient Hospital Stay (HOSPITAL_COMMUNITY): Payer: BLUE CROSS/BLUE SHIELD

## 2017-10-03 ENCOUNTER — Other Ambulatory Visit: Payer: Self-pay

## 2017-10-03 ENCOUNTER — Encounter (HOSPITAL_COMMUNITY): Payer: Self-pay | Admitting: Internal Medicine

## 2017-10-03 ENCOUNTER — Inpatient Hospital Stay (HOSPITAL_COMMUNITY): Payer: BLUE CROSS/BLUE SHIELD | Attending: Internal Medicine | Admitting: Internal Medicine

## 2017-10-03 VITALS — BP 141/96 | HR 100 | Temp 98.7°F | Resp 16 | Ht 66.0 in | Wt 196.0 lb

## 2017-10-03 DIAGNOSIS — G47419 Narcolepsy without cataplexy: Secondary | ICD-10-CM

## 2017-10-03 DIAGNOSIS — Z79899 Other long term (current) drug therapy: Secondary | ICD-10-CM | POA: Insufficient documentation

## 2017-10-03 DIAGNOSIS — C50911 Malignant neoplasm of unspecified site of right female breast: Secondary | ICD-10-CM

## 2017-10-03 DIAGNOSIS — Z170 Estrogen receptor positive status [ER+]: Secondary | ICD-10-CM | POA: Diagnosis not present

## 2017-10-03 DIAGNOSIS — Z808 Family history of malignant neoplasm of other organs or systems: Secondary | ICD-10-CM | POA: Diagnosis not present

## 2017-10-03 DIAGNOSIS — C50811 Malignant neoplasm of overlapping sites of right female breast: Secondary | ICD-10-CM

## 2017-10-03 DIAGNOSIS — Z79818 Long term (current) use of other agents affecting estrogen receptors and estrogen levels: Secondary | ICD-10-CM | POA: Diagnosis not present

## 2017-10-03 DIAGNOSIS — Z79810 Long term (current) use of selective estrogen receptor modulators (SERMs): Secondary | ICD-10-CM | POA: Diagnosis not present

## 2017-10-03 DIAGNOSIS — F329 Major depressive disorder, single episode, unspecified: Secondary | ICD-10-CM | POA: Insufficient documentation

## 2017-10-03 DIAGNOSIS — Z9011 Acquired absence of right breast and nipple: Secondary | ICD-10-CM | POA: Diagnosis not present

## 2017-10-03 DIAGNOSIS — Z923 Personal history of irradiation: Secondary | ICD-10-CM | POA: Insufficient documentation

## 2017-10-03 LAB — CBC WITH DIFFERENTIAL/PLATELET
BASOS ABS: 0 10*3/uL (ref 0.0–0.1)
BASOS PCT: 1 %
Eosinophils Absolute: 0.1 10*3/uL (ref 0.0–0.7)
Eosinophils Relative: 2 %
HEMATOCRIT: 41.7 % (ref 36.0–46.0)
HEMOGLOBIN: 13.8 g/dL (ref 12.0–15.0)
LYMPHS PCT: 21 %
Lymphs Abs: 1.3 10*3/uL (ref 0.7–4.0)
MCH: 29.4 pg (ref 26.0–34.0)
MCHC: 33.1 g/dL (ref 30.0–36.0)
MCV: 88.9 fL (ref 78.0–100.0)
Monocytes Absolute: 0.6 10*3/uL (ref 0.1–1.0)
Monocytes Relative: 9 %
NEUTROS ABS: 4.2 10*3/uL (ref 1.7–7.7)
NEUTROS PCT: 67 %
Platelets: 265 10*3/uL (ref 150–400)
RBC: 4.69 MIL/uL (ref 3.87–5.11)
RDW: 12.7 % (ref 11.5–15.5)
WBC: 6.2 10*3/uL (ref 4.0–10.5)

## 2017-10-03 LAB — COMPREHENSIVE METABOLIC PANEL
ALT: 56 U/L — AB (ref 14–54)
AST: 45 U/L — ABNORMAL HIGH (ref 15–41)
Albumin: 4.1 g/dL (ref 3.5–5.0)
Alkaline Phosphatase: 74 U/L (ref 38–126)
Anion gap: 12 (ref 5–15)
BUN: 5 mg/dL — ABNORMAL LOW (ref 6–20)
CALCIUM: 9.1 mg/dL (ref 8.9–10.3)
CHLORIDE: 101 mmol/L (ref 101–111)
CO2: 25 mmol/L (ref 22–32)
CREATININE: 0.84 mg/dL (ref 0.44–1.00)
GFR calc Af Amer: >60 mL/min (ref >60)
Glucose, Bld: 115 mg/dL — ABNORMAL HIGH (ref 65–99)
Potassium: 3.5 mmol/L (ref 3.5–5.1)
Sodium: 138 mmol/L (ref 135–145)
Total Bilirubin: 0.5 mg/dL (ref 0.3–1.2)
Total Protein: 7.4 g/dL (ref 6.5–8.1)

## 2017-10-03 MED ORDER — GOSERELIN ACETATE 3.6 MG ~~LOC~~ IMPL
3.6000 mg | DRUG_IMPLANT | Freq: Once | SUBCUTANEOUS | Status: AC
  Administered 2017-10-03: 3.6 mg via SUBCUTANEOUS
  Filled 2017-10-03: qty 3.6

## 2017-10-03 NOTE — Progress Notes (Signed)
Patient tolerated Zoladex shot with no complaints voiced.  Injection site clean and dry with no bruising or swelling noted at site.  Band aid applied. No complaints of pain with shot.  VSS with discharge and left ambulatory after oncology follow up visit.

## 2017-10-03 NOTE — Patient Instructions (Signed)
[place] at [facility]  Discharge Instructions:  You received your zoladex shot today.  Call for any questions or concerns.   _______________________________________________________________  Thank you for choosing [place] at [facility] to provide your oncology and hematology care.  To afford each patient quality time with our providers, please arrive at least 15 minutes before your scheduled appointment.  You need to re-schedule your appointment if you arrive 10 or more minutes late.  We strive to give you quality time with our providers, and arriving late affects you and other patients whose appointments are after yours.  Also, if you no show three or more times for appointments you may be dismissed from the clinic.  Again, thank you for choosing [place] at [place] hope is that these requests will allow you access to exceptional care and in a timely manner. _______________________________________________________________  If you have questions after your visit, please contact our office at [phone] between the hours of 8:30 a.m. and 5:00 p.m. Voicemails left after 4:30 p.m. will not be returned until the following business day. _______________________________________________________________  For prescription refill requests, have your pharmacy contact our office. _______________________________________________________________  Recommendations made by the consultant and any test results will be sent to your referring physician. _______________________________________________________________

## 2017-10-03 NOTE — Patient Instructions (Signed)
[place] at [facility] Discharge Instructions  RECOMMENDATIONS MADE BY THE CONSULTANT AND ANY TEST RESULTS WILL BE SENT TO YOUR REFERRING PHYSICIAN.  You were seen today by Dr. Zoila Shutter We will get an ultrasound of the right breast  We may need to get you scheduled for mammogram in April Follow up in 3 months  Thank you for choosing [place] at [facility] to provide your oncology and hematology care.  To afford each patient quality time with our provider, please arrive at least 15 minutes before your scheduled appointment time.    If you have a lab appointment with the [facility] please come in thru the  Main Entrance and check in at the main information desk  You need to re-schedule your appointment should you arrive 10 or more minutes late.  We strive to give you quality time with our providers, and arriving late affects you and other patients whose appointments are after yours.  Also, if you no show three or more times for appointments you may be dismissed from the clinic at the providers discretion.     Again, thank you for choosing [facility].  Our hope is that these requests will decrease the amount of time that you wait before being seen by our physicians.       _____________________________________________________________  Should you have questions after your visit to [facility], please contact our office at [phone] between the hours of 8:30 a.m. and 4:30 p.m.  Voicemails left after 4:30 p.m. will not be returned until the following business day.  For prescription refill requests, have your pharmacy contact our office.       Resources For Cancer Patients and their Caregivers ? American Cancer Society: Can assist with transportation, wigs, general needs, runs Look Good Feel Better.        ([phone] ? Cancer Care: Provides financial assistance, online support groups, medication/co-pay  assistance.  1-800-813-HOPE (763)136-8929) ? [place] Assists [place] cancer patients and their families through emotional , educational and financial support.  248-060-3722 ? [place] DSS Where to apply for food stamps, Medicaid and utility assistance. (228)153-8567 ? RCATS: Transportation to medical appointments. 2562171873 ? Social Security Administration: May apply for disability if have a Stage IV cancer. 972-473-5354 424 368 9441 ? [employer], Disability and Transit Services: Assists with nutrition, care and transit needs. [place] Support Programs: @10RELATIVEDAYS @ > Cancer Support Group  2nd Tuesday of the month 1pm-2pm, Journey Room  > Creative Journey  3rd Tuesday of the month 1130am-1pm, Journey Room  > Look Good Feel Better  1st Wednesday of the month 10am-12 noon, Journey Room (Call [place] to register [phone])

## 2017-10-23 DIAGNOSIS — R928 Other abnormal and inconclusive findings on diagnostic imaging of breast: Secondary | ICD-10-CM | POA: Diagnosis not present

## 2017-10-23 DIAGNOSIS — N6489 Other specified disorders of breast: Secondary | ICD-10-CM | POA: Diagnosis not present

## 2017-10-23 DIAGNOSIS — N644 Mastodynia: Secondary | ICD-10-CM | POA: Diagnosis not present

## 2017-10-23 DIAGNOSIS — C50911 Malignant neoplasm of unspecified site of right female breast: Secondary | ICD-10-CM | POA: Diagnosis not present

## 2017-10-27 NOTE — Progress Notes (Signed)
Diagnosis Malignant neoplasm of right breast in female, estrogen receptor positive, unspecified site of breast ([place]) - Plan: DG Chest 2 View, MM Digital Diagnostic Bilat, US Breast Complete Uni Right Inc Axilla  Staging Cancer Staging Invasive ductal carcinoma of breast, female, right ([place]) Staging form: Breast, AJCC 8th Edition - Pathologic stage from 02/01/2017: Stage IA (pT1c, pN0(sn), cM0, G2, ER: Positive, PR: Positive, HER2: Negative, Oncotype DX score: 21) - Signed by Twana First, MD on 02/27/2017 - Clinical: cT1c, cN0, cM0, ER: Positive, PR: Positive, HER2: Negative - Signed by Twana First, MD on 02/27/2017   Assessment and Plan:  1.  Stage IA (pT1c, pN0(sn), cM0, G2, ER: Positive, PR: Positive, HER2: Negative, Oncotype DX score: 21).  Pt was previously followed by Dr. Talbert Cage.  She remains on Tamoxifen.  She is also on monthly Zoladex.  She reports an area of concern in the right breast.  She is due for mammogram imaging.  She will be set up for diagnostic bilateral mammogram and ultrasound and pending results will be set up for follow-up.  Interval History:   47 year old female previously followed by Dr. Talbert Cage for Stage IA (pT1c, pN0(sn), cM0, G2, ER: Positive, PR: Positive, HER2: Negative, Oncotype DX score: 21).  She remains on Tamoxifen.  She is also on monthly Zoladex.    Patient had a mammogram and ultrasound done on 09/25/2016 which showed a suspicious right breast mass at 5:00 position measuring 1.4 cm. 4 mm indeterminate nodule in the right breast at 10:00 position. No evidence of right axillary lymphadenopathy. No evidence of left breast lesions.  Patient was set up for a needle core biopsy of her right breast mass at the 5:00 position. This was done on 10/10/2016 and showed invasive ductal carcinoma grade 2 and DCIS. Invasive ductal carcinoma was noted to be ER +95%, PR +60%,  HER-2 negative with a Ki-67 of 25%.  Patient reports that a biopsy of the right breast 10:00 lesion was  attempted and this was thought to be a cyst.  Right partial mastectomy with reconstruction on 02/01/17. Her final pathology was pT1cN0 ER/PR +, HER2 -.    Current Status: Patient is here today for follow-up for monthly Zoladex.  She reports an area of concern in the right breast.  She is due for mammogram imaging.       Invasive ductal carcinoma of breast, female, right ([place])   10/10/2016 Procedure    Needle core biopsy of right breast 5 o'clock position      10/12/2016 Pathology Results    Invasive ductal carcinoma, grade 2 with DCIS.  ER 95% POSITIVE, PR 60% POSITIVE, Ki-67: 25%, HER-2 NEGATIVE.      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      02/01/2017 Surgery    Right partial mastectomy/oncoplastic reduction Final path: pT1c pN0 ER/PR +, HER2 -          02/13/2017 Oncotype testing    Recurrence score of 21      04/11/2017 - 05/29/2017 Radiation Therapy     Total of 29 treatments given at [facility].        Problem List Patient Active Problem List   Diagnosis Date Noted  . Genetic testing [Z13.79] 12/10/2016  . Invasive ductal carcinoma of breast, female, right [facility]) [C50.911] 10/17/2016    Past Medical History Past Medical History:  Diagnosis Date  .  Breast cancer ([place])    right  . Depression   . Invasive ductal carcinoma of breast, female, right ([place]) 10/17/2016  . Narcolepsy     Past Surgical History Past Surgical History:  Procedure Laterality Date  . CESAREAN SECTION  2000,2005  . CHOLECYSTECTOMY      Family History Family History  Problem Relation Age of Onset  . Cancer Maternal Grandmother 62       appendiceal with metastases d.72s  . Cancer Paternal Grandfather 61       laryngeal     Social History  reports that she has never smoked. She has never used smokeless tobacco. She reports  that she does not drink alcohol or use drugs.  Medications  Current Outpatient Medications:  .  amphetamine-dextroamphetamine (ADDERALL) 30 MG tablet, , Disp: , Rfl:  .  DULoxetine (CYMBALTA) 60 MG capsule, , Disp: , Rfl:  .  fluconazole (DIFLUCAN) 150 MG tablet, TAKE ONE TABLET BY MOUTH NOW, Disp: , Rfl: 0 .  gabapentin (NEURONTIN) 300 MG capsule, Take 1 capsule (300 mg total) by mouth 2 (two) times daily., Disp: 60 capsule, Rfl: 2 .  SUMAtriptan (IMITREX) 100 MG tablet, , Disp: , Rfl:  .  tamoxifen (NOLVADEX) 20 MG tablet, , Disp: , Rfl:   Allergies Bee venom and Wellbutrin [bupropion]  Review of Systems Review of Systems - Oncology ROS as per HPI otherwise 12 point ROS is negative other than area of concern in right breast.     Physical Exam  Vitals Wt Readings from Last 3 Encounters:  10/03/17 196 lb (88.9 kg)  07/03/17 193 lb 9.6 oz (87.8 kg)  04/10/17 195 lb 11.2 oz (88.8 kg)   Temp Readings from Last 3 Encounters:  10/03/17 98.7 F (37.1 C) (Oral)  09/02/17 98.7 F (37.1 C) (Oral)  07/03/17 98.3 F (36.8 C) (Oral)   BP Readings from Last 3 Encounters:  10/03/17 (!) 141/96  09/02/17 (!) 135/96  07/03/17 (!) 143/100   Pulse Readings from Last 3 Encounters:  10/03/17 100  09/02/17 (!) 105  07/03/17 95   Constitutional: Well-developed, well-nourished, and in no distress.   HENT: Head: Normocephalic and atraumatic.  Mouth/Throat: No oropharyngeal exudate. Mucosa moist. Eyes: Pupils are equal, round, and reactive to light. Conjunctivae are normal. No scleral icterus.  Neck: Normal range of motion. Neck supple. No JVD present.  Cardiovascular: Normal rate, regular rhythm and normal heart sounds.  Exam reveals no gallop and no friction rub.   No murmur heard. Pulmonary/Chest: Effort normal and breath sounds normal. No respiratory distress. No wheezes.No rales.  Abdominal: Soft. Bowel sounds are normal. No distension. There is no tenderness. There is no guarding.   Musculoskeletal: No edema or tenderness.  Lymphadenopathy: No cervical, axillary or supraclavicular adenopathy.  Neurological: Alert and oriented to person, place, and time. No cranial nerve deficit.  Skin: Skin is warm and dry. No rash noted. No erythema. No pallor.  Psychiatric: Affect and judgment normal.  Bilateral Breast exam:  Chaperone present.  Surgical scars noted both breasts.  Labs Appointment on 10/03/2017  Component Date Value Ref Range Status  . WBC 10/03/2017 6.2  4.0 - 10.5 K/uL Final  . RBC 10/03/2017 4.69  3.87 - 5.11 MIL/uL Final  . Hemoglobin 10/03/2017 13.8  12.0 - 15.0 g/dL Final  . HCT 10/03/2017 41.7  36.0 - 46.0 % Final  . MCV 10/03/2017 88.9  78.0 - 100.0 fL Final  . MCH 10/03/2017 29.4  26.0 - 34.0 pg Final  .  MCHC 10/03/2017 33.1  30.0 - 36.0 g/dL Final  . RDW 10/03/2017 12.7  11.5 - 15.5 % Final  . Platelets 10/03/2017 265  150 - 400 K/uL Final  . Neutrophils Relative % 10/03/2017 67  % Final  . Neutro Abs 10/03/2017 4.2  1.7 - 7.7 K/uL Final  . Lymphocytes Relative 10/03/2017 21  % Final  . Lymphs Abs 10/03/2017 1.3  0.7 - 4.0 K/uL Final  . Monocytes Relative 10/03/2017 9  % Final  . Monocytes Absolute 10/03/2017 0.6  0.1 - 1.0 K/uL Final  . Eosinophils Relative 10/03/2017 2  % Final  . Eosinophils Absolute 10/03/2017 0.1  0.0 - 0.7 K/uL Final  . Basophils Relative 10/03/2017 1  % Final  . Basophils Absolute 10/03/2017 0.0  0.0 - 0.1 K/uL Final   Performed at [facility], [address], [place], [place] [address]  . Sodium 10/03/2017 138  135 - 145 mmol/L Final  . Potassium 10/03/2017 3.5  3.5 - 5.1 mmol/L Final  . Chloride 10/03/2017 101  101 - 111 mmol/L Final  . CO2 10/03/2017 25  22 - 32 mmol/L Final  . Glucose, Bld 10/03/2017 115* 65 - 99 mg/dL Final  . BUN 10/03/2017 5* 6 - 20 mg/dL Final  . Creatinine, Ser 10/03/2017 0.84  0.44 - 1.00 mg/dL Final  . Calcium 10/03/2017 9.1  8.9 - 10.3 mg/dL Final  . Total Protein 10/03/2017 7.4  6.5 - 8.1  g/dL Final  . Albumin 10/03/2017 4.1  3.5 - 5.0 g/dL Final  . AST 10/03/2017 45* 15 - 41 U/L Final  . ALT 10/03/2017 56* 14 - 54 U/L Final  . Alkaline Phosphatase 10/03/2017 74  38 - 126 U/L Final  . Total Bilirubin 10/03/2017 0.5  0.3 - 1.2 mg/dL Final  . GFR calc non Af Amer 10/03/2017 >60  >60 mL/min Final  . GFR calc Af Amer 10/03/2017 >60  >60 mL/min Final   Comment: (NOTE) The eGFR has been calculated using the CKD EPI equation. This calculation has not been validated in all clinical situations. eGFR's persistently <60 mL/min signify possible Chronic Kidney Disease.   Georgiann Hahn gap 10/03/2017 12  5 - 15 Final   Performed at [facility], [address], [place], [place] [address]     Pathology Orders Placed This Encounter  Procedures  . DG Chest 2 View    Standing Status:   Future    Standing Expiration Date:   10/03/2018    Order Specific Question:   Reason for Exam (SYMPTOM  OR DIAGNOSIS REQUIRED)    Answer:   Breast cancer, SOB    Order Specific Question:   Is patient pregnant?    Answer:   No    Order Specific Question:   Preferred imaging location?    Answer:   [facility]    Order Specific Question:   Radiology Contrast Protocol - do NOT remove file path    Answer:   [email or web address]  . MM Digital Diagnostic Bilat    Standing Status:   Future    Standing Expiration Date:   10/03/2018    Order Specific Question:   Reason for Exam (SYMPTOM  OR DIAGNOSIS REQUIRED)    Answer:   right breast cancer.  Pain in right breast    Order Specific Question:   Is the patient pregnant?    Answer:   No    Order Specific Question:   Preferred imaging location?    Answer:   External  .  US Breast Complete Uni Right Inc Axilla    Standing Status:   Future    Standing Expiration Date:   12/02/2018    Order Specific Question:   Reason for Exam (SYMPTOM  OR DIAGNOSIS REQUIRED)    Answer:   right breast pain.  Pt reports palpable area in  right breast    Order Specific Question:   Preferred imaging location?    Answer:   External       Zoila Shutter MD

## 2017-10-31 ENCOUNTER — Encounter (HOSPITAL_COMMUNITY): Payer: Self-pay

## 2017-10-31 ENCOUNTER — Other Ambulatory Visit: Payer: Self-pay

## 2017-10-31 ENCOUNTER — Inpatient Hospital Stay (HOSPITAL_COMMUNITY): Payer: BLUE CROSS/BLUE SHIELD | Attending: Internal Medicine

## 2017-10-31 VITALS — BP 136/86 | HR 90 | Temp 98.3°F | Resp 18

## 2017-10-31 DIAGNOSIS — C50811 Malignant neoplasm of overlapping sites of right female breast: Secondary | ICD-10-CM | POA: Insufficient documentation

## 2017-10-31 DIAGNOSIS — C50911 Malignant neoplasm of unspecified site of right female breast: Secondary | ICD-10-CM

## 2017-10-31 DIAGNOSIS — Z5111 Encounter for antineoplastic chemotherapy: Secondary | ICD-10-CM | POA: Insufficient documentation

## 2017-10-31 MED ORDER — GOSERELIN ACETATE 3.6 MG ~~LOC~~ IMPL
3.6000 mg | DRUG_IMPLANT | Freq: Once | SUBCUTANEOUS | Status: AC
  Administered 2017-10-31: 3.6 mg via SUBCUTANEOUS
  Filled 2017-10-31: qty 3.6

## 2017-10-31 NOTE — Patient Instructions (Signed)
[place] at [facility] Discharge Instructions  Injection given today. Follow up as scheduled.   Thank you for choosing [place] at [facility] to provide your oncology and hematology care.  To afford each patient quality time with our provider, please arrive at least 15 minutes before your scheduled appointment time.   If you have a lab appointment with the [facility] please come in thru the  Main Entrance and check in at the main information desk  You need to re-schedule your appointment should you arrive 10 or more minutes late.  We strive to give you quality time with our providers, and arriving late affects you and other patients whose appointments are after yours.  Also, if you no show three or more times for appointments you may be dismissed from the clinic at the providers discretion.     Again, thank you for choosing [facility].  Our hope is that these requests will decrease the amount of time that you wait before being seen by our physicians.       _____________________________________________________________  Should you have questions after your visit to [facility], please contact our office at [phone] between the hours of 8:30 a.m. and 4:30 p.m.  Voicemails left after 4:30 p.m. will not be returned until the following business day.  For prescription refill requests, have your pharmacy contact our office.       Resources For Cancer Patients and their Caregivers ? American Cancer Society: Can assist with transportation, wigs, general needs, runs Look Good Feel Better.        704-032-4937 ? Cancer Care: Provides financial assistance, online support groups, medication/co-pay assistance.  1-800-813-HOPE 312 096 0904) ? [facility] Assists [place] cancer patients and their families through emotional , educational and financial support.  608-083-0056 ? [place]  DSS Where to apply for food stamps, Medicaid and utility assistance. 2037146655 ? [employer]: Transportation to medical appointments. 917 634 3803 ? Social Security Administration: May apply for disability if have a Stage IV cancer. 2204427728 458-038-3550 ? [employer], Disability and Transit Services: Assists with nutrition, care and transit needs. [place] Support Programs:   > Cancer Support Group  2nd Tuesday of the month 1pm-2pm, Journey Room   > Creative Journey  3rd Tuesday of the month 1130am-1pm, Journey Room

## 2017-10-31 NOTE — Progress Notes (Signed)
Patient is taking Tamoxifan and has not missed any doses and reports no side effects at this time.

## 2017-11-28 ENCOUNTER — Ambulatory Visit (HOSPITAL_COMMUNITY): Payer: BLUE CROSS/BLUE SHIELD

## 2017-12-02 ENCOUNTER — Encounter (HOSPITAL_COMMUNITY): Payer: Self-pay

## 2017-12-02 ENCOUNTER — Inpatient Hospital Stay (HOSPITAL_COMMUNITY): Payer: BLUE CROSS/BLUE SHIELD | Attending: Internal Medicine

## 2017-12-02 VITALS — BP 139/93 | HR 100 | Temp 98.2°F | Resp 18

## 2017-12-02 DIAGNOSIS — Z5111 Encounter for antineoplastic chemotherapy: Secondary | ICD-10-CM | POA: Insufficient documentation

## 2017-12-02 DIAGNOSIS — C50911 Malignant neoplasm of unspecified site of right female breast: Secondary | ICD-10-CM

## 2017-12-02 DIAGNOSIS — Z170 Estrogen receptor positive status [ER+]: Secondary | ICD-10-CM | POA: Diagnosis not present

## 2017-12-02 DIAGNOSIS — C50811 Malignant neoplasm of overlapping sites of right female breast: Secondary | ICD-10-CM | POA: Diagnosis not present

## 2017-12-02 MED ORDER — GOSERELIN ACETATE 3.6 MG ~~LOC~~ IMPL
3.6000 mg | DRUG_IMPLANT | Freq: Once | SUBCUTANEOUS | Status: AC
  Administered 2017-12-02: 3.6 mg via SUBCUTANEOUS
  Filled 2017-12-02: qty 3.6

## 2017-12-02 NOTE — Progress Notes (Signed)
Patient tolerated shot with no complaints of pain voiced.  Injection site clean and dry with no bruising or swelling noted at site.  Band aid applied.  VSS with discharge and left ambulatory with no s/s of distress noted.

## 2017-12-02 NOTE — Patient Instructions (Signed)
[place] at [facility]  Discharge Instructions:  You received a zoladex shot today.  _______________________________________________________________  Thank you for choosing [place] at [facility] to provide your oncology and hematology care.  To afford each patient quality time with our providers, please arrive at least 15 minutes before your scheduled appointment.  You need to re-schedule your appointment if you arrive 10 or more minutes late.  We strive to give you quality time with our providers, and arriving late affects you and other patients whose appointments are after yours.  Also, if you no show three or more times for appointments you may be dismissed from the clinic.  Again, thank you for choosing [place] at [facility] [place] hope is that these requests will allow you access to exceptional care and in a timely manner. _______________________________________________________________  If you have questions after your visit, please contact our office at [phone] between the hours of 8:30 a.m. and 5:00 p.m. Voicemails left after 4:30 p.m. will not be returned until the following business day. _______________________________________________________________  For prescription refill requests, have your pharmacy contact our office. _______________________________________________________________  Recommendations made by the consultant and any test results will be sent to your referring physician. _______________________________________________________________

## 2017-12-26 ENCOUNTER — Other Ambulatory Visit (HOSPITAL_COMMUNITY): Payer: BLUE CROSS/BLUE SHIELD

## 2017-12-26 ENCOUNTER — Ambulatory Visit (HOSPITAL_COMMUNITY): Payer: BLUE CROSS/BLUE SHIELD | Admitting: Internal Medicine

## 2017-12-26 ENCOUNTER — Ambulatory Visit (HOSPITAL_COMMUNITY): Payer: BLUE CROSS/BLUE SHIELD

## 2017-12-30 ENCOUNTER — Other Ambulatory Visit (HOSPITAL_COMMUNITY): Payer: Self-pay | Admitting: *Deleted

## 2017-12-30 DIAGNOSIS — C50919 Malignant neoplasm of unspecified site of unspecified female breast: Secondary | ICD-10-CM

## 2017-12-31 ENCOUNTER — Inpatient Hospital Stay (HOSPITAL_COMMUNITY): Payer: BLUE CROSS/BLUE SHIELD

## 2017-12-31 ENCOUNTER — Inpatient Hospital Stay (HOSPITAL_COMMUNITY): Payer: BLUE CROSS/BLUE SHIELD | Admitting: Internal Medicine

## 2017-12-31 ENCOUNTER — Other Ambulatory Visit: Payer: Self-pay

## 2017-12-31 ENCOUNTER — Inpatient Hospital Stay (HOSPITAL_COMMUNITY): Payer: BLUE CROSS/BLUE SHIELD | Attending: Hematology

## 2017-12-31 VITALS — HR 99 | Temp 98.5°F | Resp 18 | Ht 66.0 in | Wt 198.2 lb

## 2017-12-31 DIAGNOSIS — Z79818 Long term (current) use of other agents affecting estrogen receptors and estrogen levels: Secondary | ICD-10-CM

## 2017-12-31 DIAGNOSIS — E876 Hypokalemia: Secondary | ICD-10-CM | POA: Diagnosis not present

## 2017-12-31 DIAGNOSIS — C50911 Malignant neoplasm of unspecified site of right female breast: Secondary | ICD-10-CM | POA: Diagnosis not present

## 2017-12-31 DIAGNOSIS — R7989 Other specified abnormal findings of blood chemistry: Secondary | ICD-10-CM

## 2017-12-31 DIAGNOSIS — Z79810 Long term (current) use of selective estrogen receptor modulators (SERMs): Secondary | ICD-10-CM | POA: Diagnosis not present

## 2017-12-31 DIAGNOSIS — K760 Fatty (change of) liver, not elsewhere classified: Secondary | ICD-10-CM | POA: Insufficient documentation

## 2017-12-31 DIAGNOSIS — Z170 Estrogen receptor positive status [ER+]: Secondary | ICD-10-CM | POA: Insufficient documentation

## 2017-12-31 DIAGNOSIS — C50919 Malignant neoplasm of unspecified site of unspecified female breast: Secondary | ICD-10-CM

## 2017-12-31 DIAGNOSIS — N912 Amenorrhea, unspecified: Secondary | ICD-10-CM | POA: Diagnosis not present

## 2017-12-31 LAB — COMPREHENSIVE METABOLIC PANEL
ALBUMIN: 3.8 g/dL (ref 3.5–5.0)
ALT: 59 U/L — ABNORMAL HIGH (ref 14–54)
ANION GAP: 10 (ref 5–15)
AST: 42 U/L — ABNORMAL HIGH (ref 15–41)
Alkaline Phosphatase: 81 U/L (ref 38–126)
BILIRUBIN TOTAL: 0.9 mg/dL (ref 0.3–1.2)
BUN: 10 mg/dL (ref 6–20)
CO2: 25 mmol/L (ref 22–32)
Calcium: 9.1 mg/dL (ref 8.9–10.3)
Chloride: 102 mmol/L (ref 101–111)
Creatinine, Ser: 1.03 mg/dL — ABNORMAL HIGH (ref 0.44–1.00)
GFR calc Af Amer: >60 mL/min (ref >60)
GLUCOSE: 137 mg/dL — AB (ref 65–99)
Potassium: 3.1 mmol/L — ABNORMAL LOW (ref 3.5–5.1)
Sodium: 137 mmol/L (ref 135–145)
TOTAL PROTEIN: 7.2 g/dL (ref 6.5–8.1)

## 2017-12-31 LAB — CBC WITH DIFFERENTIAL/PLATELET
BASOS PCT: 1 %
Basophils Absolute: 0 10*3/uL (ref 0.0–0.1)
Eosinophils Absolute: 0.1 10*3/uL (ref 0.0–0.7)
Eosinophils Relative: 2 %
HEMATOCRIT: 40.2 % (ref 36.0–46.0)
HEMOGLOBIN: 13.8 g/dL (ref 12.0–15.0)
LYMPHS ABS: 1.5 10*3/uL (ref 0.7–4.0)
LYMPHS PCT: 25 %
MCH: 29.9 pg (ref 26.0–34.0)
MCHC: 34.3 g/dL (ref 30.0–36.0)
MCV: 87.2 fL (ref 78.0–100.0)
MONO ABS: 0.5 10*3/uL (ref 0.1–1.0)
MONOS PCT: 9 %
NEUTROS PCT: 63 %
Neutro Abs: 3.9 10*3/uL (ref 1.7–7.7)
Platelets: 257 10*3/uL (ref 150–400)
RBC: 4.61 MIL/uL (ref 3.87–5.11)
RDW: 12.5 % (ref 11.5–15.5)
WBC: 6.1 10*3/uL (ref 4.0–10.5)

## 2017-12-31 MED ORDER — GOSERELIN ACETATE 3.6 MG ~~LOC~~ IMPL
3.6000 mg | DRUG_IMPLANT | Freq: Once | SUBCUTANEOUS | Status: AC
  Administered 2017-12-31: 3.6 mg via SUBCUTANEOUS
  Filled 2017-12-31: qty 3.6

## 2017-12-31 NOTE — Patient Instructions (Signed)
[facility] at [facility]  Discharge Instructions:   Today you saw Dr. Higgs.    _______________________________________________________________  Thank you for choosing [facility] at [facility] to provide your oncology and hematology care.  To afford each patient quality time with our providers, please arrive at least 15 minutes before your scheduled appointment.  You need to re-schedule your appointment if you arrive 10 or more minutes late.  We strive to give you quality time with our providers, and arriving late affects you and other patients whose appointments are after yours.  Also, if you no show three or more times for appointments you may be dismissed from the clinic.  Again, thank you for choosing [facility]. Our hope is that these requests will allow you access to exceptional care and in a timely manner. _______________________________________________________________  If you have questions after your visit, please contact our office at [phone] between the hours of 8:30 a.m. and 5:00 p.m. Voicemails left after 4:30 p.m. will not be returned until the following business day. _______________________________________________________________  For prescription refill requests, have your pharmacy contact our office. _______________________________________________________________  Recommendations made by the consultant and any test results will be sent to your referring physician. _______________________________________________________________ 

## 2017-12-31 NOTE — Progress Notes (Signed)
Diagnosis Amenorrhea - Plan: Follicle stimulating hormone, Luteinizing hormone, Estradiol, CBC with Differential/Platelet, Comprehensive metabolic panel, Lactate dehydrogenase  Fatty (change of) liver, not elsewhere classified - Plan: CT CHEST W CONTRAST, CT Abdomen Pelvis W Contrast, Follicle stimulating hormone, Luteinizing hormone, Estradiol, CBC with Differential/Platelet, Comprehensive metabolic panel, Lactate dehydrogenase  Staging Cancer Staging Invasive ductal carcinoma of breast, female, right ([place]) Staging form: Breast, AJCC 8th Edition - Pathologic stage from 02/01/2017: Stage IA (pT1c, pN0(sn), cM0, G2, ER: Positive, PR: Positive, HER2: Negative, Oncotype DX score: 21) - Signed by Twana First, MD on 02/27/2017 - Clinical: cT1c, cN0, cM0, ER: Positive, PR: Positive, HER2: Negative - Signed by Twana First, MD on 02/27/2017   Assessment and Plan:  1.  Stage IA (pT1c, pN0(sn), cM0, G2, ER: Positive, PR: Positive, HER2: Negative, Oncotype DX score: 21).  Pt was previously followed by Dr. Talbert Cage.  She remains on Tamoxifen.  She is also on monthly Zoladex.  She reports an area of concern in the right breast as well as breast pain.    She is here to go over diagnostic bilateral mammogram and ultrasound done at [facility] on 10/23/2017 with findings of no evidence of malignancy in either breast.  Few oil cysts present.  She will be referred back to surgeon at [facility] due to ongoing complaints of breast pain.  She will RTC for follow-up in 3 months.    2.  Elevated LFTs.  She reports she was told in the past she had a fatty liver.  She is not on cholesterol medications.  She will be set up for CT CAP due to elevated LFTs and ongoing complaints of breast pain.  She will RTC to go over results.    3.  Irregular cycles.  Pt was started on Zoladex for ovarian suppression by Dr. Talbert Cage.  She reports LMP was in 10/2017 which occurred after she missed a shot.  Will check FSH, E2 and LH on RTC in 1 month.  I  discussed with her she is 47 so may be perimenopausal and may consider continuing with Tamoxifen alone versus switch to AI pending hormone levels.    4.  Hypokalemia.  She is complaining of muscle spasms.  Labs reviewed with pt done 12/31/2017 show K+ level of 3.1.  She will be given 7 days of potassium supplementation with 20 meq.  Follow-up with PCP for repeat labs.  Interval History:   47 year old female previously followed by Dr. Talbert Cage for Stage IA (pT1c, pN0(sn), cM0, G2, ER: Positive, PR: Positive, HER2: Negative, Oncotype DX score: 21).  She remains on Tamoxifen.  She is also on monthly Zoladex for ovarian suppression.      Patient had a mammogram and ultrasound done on 09/25/2016 which showed a suspicious right breast mass at 5:00 position measuring 1.4 cm. 4 mm indeterminate nodule in the right breast at 10:00 position. No evidence of right axillary lymphadenopathy. No evidence of left breast lesions.  Patient was set up for a needle core biopsy of her right breast mass at the 5:00 position. This was done on 10/10/2016 and showed invasive ductal carcinoma grade 2 and DCIS. Invasive ductal carcinoma was noted to be ER +95%, PR +60%,  HER-2 negative with a Ki-67 of 25%.  Patient reports that a biopsy of the right breast 10:00 lesion was attempted and this was thought to be a cyst.  Right partial mastectomy with reconstruction on 02/01/17. Her final pathology was pT1cN0 ER/PR +, HER2 -.    Current Status:  Patient is here today for follow-up for monthly Zoladex and to go over recent mammogram.  She continues to complain of breast pain.  She thinks her last period was in 10/2017.  She reports her cycles are heavy if she misses Zoladex.      Invasive ductal carcinoma of breast, female, right ([place])   10/10/2016 Procedure    Needle core biopsy of right breast 5 o'clock position      10/12/2016 Pathology Results    Invasive ductal carcinoma, grade 2 with DCIS.  ER 95% POSITIVE, PR 60% POSITIVE,  Ki-67: 25%, HER-2 NEGATIVE.      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      02/01/2017 Surgery    Right partial mastectomy/oncoplastic reduction Final path: pT1c pN0 ER/PR +, HER2 -          02/13/2017 Oncotype testing    Recurrence score of 21      04/11/2017 - 05/29/2017 Radiation Therapy     Total of 29 treatments given at [facility].        Problem List Patient Active Problem List   Diagnosis Date Noted  . Genetic testing [Z13.79] 12/10/2016  . Invasive ductal carcinoma of breast, female, right [facility]) [C50.911] 10/17/2016    Past Medical History Past Medical History:  Diagnosis Date  . Breast cancer ([place])    right  . Depression   . Invasive ductal carcinoma of breast, female, right ([place]) 10/17/2016  . Narcolepsy     Past Surgical History Past Surgical History:  Procedure Laterality Date  . CESAREAN SECTION  2000,2005  . CHOLECYSTECTOMY      Family History Family History  Problem Relation Age of Onset  . Cancer Maternal Grandmother 66       appendiceal with metastases d.72s  . Cancer Paternal Grandfather 51       laryngeal     Social History  reports that she has never smoked. She has never used smokeless tobacco. She reports that she does not drink alcohol or use drugs.  Medications  Current Outpatient Medications:  .  amphetamine-dextroamphetamine (ADDERALL) 30 MG tablet, , Disp: , Rfl:  .  DULoxetine (CYMBALTA) 60 MG capsule, , Disp: , Rfl:  .  gabapentin (NEURONTIN) 300 MG capsule, Take 1 capsule (300 mg total) by mouth 2 (two) times daily., Disp: 60 capsule, Rfl: 2 .  SUMAtriptan (IMITREX) 100 MG tablet, , Disp: , Rfl:  .  tamoxifen (NOLVADEX) 20 MG tablet, , Disp: , Rfl:   Allergies Bee venom and Wellbutrin [bupropion]  Review of Systems Review of Systems - Oncology ROS as per HPI  otherwise 12 point ROS is negative.   Physical Exam  Vitals Wt Readings from Last 3 Encounters:  12/31/17 198 lb 3.2 oz (89.9 kg)  10/03/17 196 lb (88.9 kg)  07/03/17 193 lb 9.6 oz (87.8 kg)   Temp Readings from Last 3 Encounters:  12/31/17 98.5 F (36.9 C) (Oral)  12/02/17 98.2 F (36.8 C) (Oral)  10/31/17 98.3 F (36.8 C) (Oral)   BP Readings from Last 3 Encounters:  12/02/17 (!) 139/93  10/31/17 136/86  10/03/17 (!) 141/96   Pulse Readings from Last 3 Encounters:  12/31/17 99  12/02/17 100  10/31/17 90   Constitutional: Well-developed, well-nourished, and in no distress.   HENT: Head: Normocephalic and atraumatic.  Mouth/Throat: No oropharyngeal exudate. Mucosa moist. Eyes: Pupils  are equal, round, and reactive to light. Conjunctivae are normal. No scleral icterus.  Neck: Normal range of motion. Neck supple. No JVD present.  Cardiovascular: Normal rate, regular rhythm and normal heart sounds.  Exam reveals no gallop and no friction rub.   No murmur heard. Pulmonary/Chest: Effort normal and breath sounds normal. No respiratory distress. No wheezes.No rales.  Abdominal: Soft. Bowel sounds are normal. No distension. There is no tenderness. There is no guarding.  Musculoskeletal: No edema or tenderness.  Lymphadenopathy: No cervical, axillary  or supraclavicular adenopathy.  Neurological: Alert and oriented to person, place, and time. No cranial nerve deficit.  Skin: Skin is warm and dry. No rash noted. No erythema. No pallor.  Psychiatric: Affect and judgment normal.  Bilateral breast exam:  Surgical scars noted both breasts.  Pt reports ongoing breast pain.    Labs Appointment on 12/31/2017  Component Date Value Ref Range Status  . WBC 12/31/2017 6.1  4.0 - 10.5 K/uL Final  . RBC 12/31/2017 4.61  3.87 - 5.11 MIL/uL Final  . Hemoglobin 12/31/2017 13.8  12.0 - 15.0 g/dL Final  . HCT 12/31/2017 40.2  36.0 - 46.0 % Final  . MCV 12/31/2017 87.2  78.0 - 100.0 fL Final   . MCH 12/31/2017 29.9  26.0 - 34.0 pg Final  . MCHC 12/31/2017 34.3  30.0 - 36.0 g/dL Final  . RDW 12/31/2017 12.5  11.5 - 15.5 % Final  . Platelets 12/31/2017 257  150 - 400 K/uL Final  . Neutrophils Relative % 12/31/2017 63  % Final  . Neutro Abs 12/31/2017 3.9  1.7 - 7.7 K/uL Final  . Lymphocytes Relative 12/31/2017 25  % Final  . Lymphs Abs 12/31/2017 1.5  0.7 - 4.0 K/uL Final  . Monocytes Relative 12/31/2017 9  % Final  . Monocytes Absolute 12/31/2017 0.5  0.1 - 1.0 K/uL Final  . Eosinophils Relative 12/31/2017 2  % Final  . Eosinophils Absolute 12/31/2017 0.1  0.0 - 0.7 K/uL Final  . Basophils Relative 12/31/2017 1  % Final  . Basophils Absolute 12/31/2017 0.0  0.0 - 0.1 K/uL Final   Performed at [facility], [address] [place], [place] [address]  . Sodium 12/31/2017 137  135 - 145 mmol/L Final  . Potassium 12/31/2017 3.1* 3.5 - 5.1 mmol/L Final  . Chloride 12/31/2017 102  101 - 111 mmol/L Final  . CO2 12/31/2017 25  22 - 32 mmol/L Final  . Glucose, Bld 12/31/2017 137* 65 - 99 mg/dL Final  . BUN 12/31/2017 10  6 - 20 mg/dL Final  . Creatinine, Ser 12/31/2017 1.03* 0.44 - 1.00 mg/dL Final  . Calcium 12/31/2017 9.1  8.9 - 10.3 mg/dL Final  . Total Protein 12/31/2017 7.2  6.5 - 8.1 g/dL Final  . Albumin 12/31/2017 3.8  3.5 - 5.0 g/dL Final  . AST 12/31/2017 42* 15 - 41 U/L Final  . ALT 12/31/2017 59* 14 - 54 U/L Final  . Alkaline Phosphatase 12/31/2017 81  38 - 126 U/L Final  . Total Bilirubin 12/31/2017 0.9  0.3 - 1.2 mg/dL Final  . GFR calc non Af Amer 12/31/2017 >60  >60 mL/min Final  . GFR calc Af Amer 12/31/2017 >60  >60 mL/min Final   Comment: (NOTE) The eGFR has been calculated using the CKD EPI equation. This calculation has not been validated in all clinical situations. eGFR's persistently <60 mL/min signify possible Chronic Kidney Disease.   . Anion gap 12/31/2017 10  5 - 15 Final  Performed at [facility], [address] [place], [place] [address]      Pathology Orders Placed This Encounter  Procedures  . CT CHEST W CONTRAST    Standing Status:   Future    Standing Expiration Date:   12/31/2018    Order Specific Question:   If indicated for the ordered procedure, I authorize the administration of contrast media per Radiology protocol    Answer:   Yes    Order Specific Question:   Preferred imaging location?    Answer:   [facility]    Order Specific Question:   Radiology Contrast Protocol - do NOT remove file path    Answer:   \\[email or web address].pdf  . CT Abdomen Pelvis W Contrast    Standing Status:   Future    Standing Expiration Date:   12/31/2018    Order Specific Question:   If indicated for the ordered procedure, I authorize the administration of contrast media per Radiology protocol    Answer:   Yes    Order Specific Question:   Is patient pregnant?    Answer:   No    Order Specific Question:   Preferred imaging location?    Answer:   [facility]    Order Specific Question:   Is Oral Contrast requested for this exam?    Answer:   Yes, Per Radiology protocol    Order Specific Question:   Radiology Contrast Protocol - do NOT remove file path    Answer:   \\[email or web address].pdf  . Follicle stimulating hormone    Standing Status:   Future    Standing Expiration Date:   01/01/2019  . Luteinizing hormone    Standing Status:   Future    Standing Expiration Date:   01/01/2019  . Estradiol    Standing Status:   Future    Standing Expiration Date:   01/01/2019  . CBC with Differential/Platelet    Standing Status:   Future    Standing Expiration Date:   01/01/2019  . Comprehensive metabolic panel    Standing Status:   Future    Standing Expiration Date:   01/01/2019  . Lactate dehydrogenase    Standing Status:   Future    Standing Expiration Date:   01/01/2019       Zoila Shutter MD

## 2017-12-31 NOTE — Patient Instructions (Signed)
[place] at [facility]  Discharge Instructions:  You received a zoldex shot today.  _______________________________________________________________  Thank you for choosing [place] at [facility] to provide your oncology and hematology care.  To afford each patient quality time with our providers, please arrive at least 15 minutes before your scheduled appointment.  You need to re-schedule your appointment if you arrive 10 or more minutes late.  We strive to give you quality time with our providers, and arriving late affects you and other patients whose appointments are after yours.  Also, if you no show three or more times for appointments you may be dismissed from the clinic.  Again, thank you for choosing [place] at [place] hope is that these requests will allow you access to exceptional care and in a timely manner. _______________________________________________________________  If you have questions after your visit, please contact our office at [phone] between the hours of 8:30 a.m. and 5:00 p.m. Voicemails left after 4:30 p.m. will not be returned until the following business day. _______________________________________________________________  For prescription refill requests, have your pharmacy contact our office. _______________________________________________________________  Recommendations made by the consultant and any test results will be sent to your referring physician. _______________________________________________________________

## 2018-01-08 ENCOUNTER — Other Ambulatory Visit (HOSPITAL_COMMUNITY): Payer: Self-pay | Admitting: Internal Medicine

## 2018-01-08 ENCOUNTER — Ambulatory Visit (HOSPITAL_COMMUNITY)
Admission: RE | Admit: 2018-01-08 | Discharge: 2018-01-08 | Disposition: A | Discharge status: Home / Self Care | Payer: BLUE CROSS/BLUE SHIELD | Source: Ambulatory Visit | Attending: Internal Medicine | Admitting: Internal Medicine

## 2018-01-08 ENCOUNTER — Encounter (HOSPITAL_COMMUNITY): Payer: Self-pay | Admitting: Radiology

## 2018-01-08 DIAGNOSIS — K760 Fatty (change of) liver, not elsewhere classified: Secondary | ICD-10-CM | POA: Diagnosis not present

## 2018-01-08 DIAGNOSIS — Z9889 Other specified postprocedural states: Secondary | ICD-10-CM | POA: Diagnosis not present

## 2018-01-08 DIAGNOSIS — Z853 Personal history of malignant neoplasm of breast: Secondary | ICD-10-CM | POA: Diagnosis not present

## 2018-01-08 MED ORDER — IOPAMIDOL (ISOVUE-300) INJECTION 61%
100.0000 mL | Freq: Once | INTRAVENOUS | Status: AC | PRN
  Administered 2018-01-08: 100 mL via INTRAVENOUS

## 2018-01-14 DIAGNOSIS — E8881 Metabolic syndrome: Secondary | ICD-10-CM | POA: Diagnosis not present

## 2018-01-14 DIAGNOSIS — F331 Major depressive disorder, recurrent, moderate: Secondary | ICD-10-CM | POA: Diagnosis not present

## 2018-01-14 DIAGNOSIS — F900 Attention-deficit hyperactivity disorder, predominantly inattentive type: Secondary | ICD-10-CM | POA: Diagnosis not present

## 2018-01-14 DIAGNOSIS — G47419 Narcolepsy without cataplexy: Secondary | ICD-10-CM | POA: Diagnosis not present

## 2018-01-28 ENCOUNTER — Inpatient Hospital Stay (HOSPITAL_COMMUNITY): Payer: BLUE CROSS/BLUE SHIELD

## 2018-01-28 ENCOUNTER — Other Ambulatory Visit (HOSPITAL_COMMUNITY): Payer: BLUE CROSS/BLUE SHIELD

## 2018-01-28 ENCOUNTER — Other Ambulatory Visit: Payer: Self-pay

## 2018-01-28 ENCOUNTER — Inpatient Hospital Stay (HOSPITAL_COMMUNITY): Payer: BLUE CROSS/BLUE SHIELD | Attending: Internal Medicine

## 2018-01-28 ENCOUNTER — Encounter (HOSPITAL_COMMUNITY): Payer: Self-pay

## 2018-01-28 VITALS — BP 141/99 | HR 88 | Temp 98.6°F | Resp 18

## 2018-01-28 DIAGNOSIS — C50911 Malignant neoplasm of unspecified site of right female breast: Secondary | ICD-10-CM | POA: Diagnosis not present

## 2018-01-28 DIAGNOSIS — Z5111 Encounter for antineoplastic chemotherapy: Secondary | ICD-10-CM | POA: Insufficient documentation

## 2018-01-28 DIAGNOSIS — N912 Amenorrhea, unspecified: Secondary | ICD-10-CM

## 2018-01-28 DIAGNOSIS — K760 Fatty (change of) liver, not elsewhere classified: Secondary | ICD-10-CM

## 2018-01-28 LAB — CBC WITH DIFFERENTIAL/PLATELET
Basophils Absolute: 0 10*3/uL (ref 0.0–0.1)
Basophils Relative: 1 %
EOS ABS: 0.2 10*3/uL (ref 0.0–0.7)
Eosinophils Relative: 3 %
HCT: 40.7 % (ref 36.0–46.0)
Hemoglobin: 13.8 g/dL (ref 12.0–15.0)
LYMPHS ABS: 1.4 10*3/uL (ref 0.7–4.0)
Lymphocytes Relative: 23 %
MCH: 30.3 pg (ref 26.0–34.0)
MCHC: 33.9 g/dL (ref 30.0–36.0)
MCV: 89.3 fL (ref 78.0–100.0)
MONO ABS: 0.5 10*3/uL (ref 0.1–1.0)
Monocytes Relative: 9 %
Neutro Abs: 4 10*3/uL (ref 1.7–7.7)
Neutrophils Relative %: 64 %
Platelets: 214 10*3/uL (ref 150–400)
RBC: 4.56 MIL/uL (ref 3.87–5.11)
RDW: 13 % (ref 11.5–15.5)
WBC: 6.1 10*3/uL (ref 4.0–10.5)

## 2018-01-28 LAB — COMPREHENSIVE METABOLIC PANEL
ALT: 64 U/L — AB (ref 0–44)
ANION GAP: 9 (ref 5–15)
AST: 41 U/L (ref 15–41)
Albumin: 4.1 g/dL (ref 3.5–5.0)
Alkaline Phosphatase: 86 U/L (ref 38–126)
BUN: 11 mg/dL (ref 6–20)
CHLORIDE: 107 mmol/L (ref 98–111)
CO2: 27 mmol/L (ref 22–32)
CREATININE: 0.74 mg/dL (ref 0.44–1.00)
Calcium: 8.9 mg/dL (ref 8.9–10.3)
GFR calc non Af Amer: >60 mL/min (ref >60)
Glucose, Bld: 115 mg/dL — ABNORMAL HIGH (ref 70–99)
POTASSIUM: 3.5 mmol/L (ref 3.5–5.1)
SODIUM: 143 mmol/L (ref 135–145)
Total Bilirubin: 0.9 mg/dL (ref 0.3–1.2)
Total Protein: 7.2 g/dL (ref 6.5–8.1)

## 2018-01-28 LAB — LACTATE DEHYDROGENASE: LDH: 170 U/L (ref 98–192)

## 2018-01-28 MED ORDER — GOSERELIN ACETATE 3.6 MG ~~LOC~~ IMPL
3.6000 mg | DRUG_IMPLANT | Freq: Once | SUBCUTANEOUS | Status: AC
  Administered 2018-01-28: 3.6 mg via SUBCUTANEOUS
  Filled 2018-01-28: qty 3.6

## 2018-01-28 NOTE — Patient Instructions (Signed)
[place] at [facility] Discharge Instructions  Zoladex injection today. Follow up as scheduled.   Thank you for choosing [place] at [facility] to provide your oncology and hematology care.  To afford each patient quality time with our provider, please arrive at least 15 minutes before your scheduled appointment time.   If you have a lab appointment with the [place] please come in thru the  Main Entrance and check in at the main information desk  You need to re-schedule your appointment should you arrive 10 or more minutes late.  We strive to give you quality time with our providers, and arriving late affects you and other patients whose appointments are after yours.  Also, if you no show three or more times for appointments you may be dismissed from the clinic at the providers discretion.     Again, thank you for choosing [facility].  Our hope is that these requests will decrease the amount of time that you wait before being seen by our physicians.       _____________________________________________________________  Should you have questions after your visit to [facility], please contact our office at [phone] between the hours of 8:30 a.m. and 4:30 p.m.  Voicemails left after 4:30 p.m. will not be returned until the following business day.  For prescription refill requests, have your pharmacy contact our office.       Resources For Cancer Patients and their Caregivers ? American Cancer Society: Can assist with transportation, wigs, general needs, runs Look Good Feel Better.        (228) 855-7442 ? Cancer Care: Provides financial assistance, online support groups, medication/co-pay assistance.  1-800-813-HOPE 9360097322) ? [place] Assists [place] cancer patients and their families through emotional , educational and financial support.  (613) 817-4591 ? [place]  DSS Where to apply for food stamps, Medicaid and utility assistance. 336-530-8389 ? [employer]: Transportation to medical appointments. (252)472-8584 ? Social Security Administration: May apply for disability if have a Stage IV cancer. 321-327-6899 (816) 270-8024 ? [employer], Disability and Transit Services: Assists with nutrition, care and transit needs. [place] Support Programs:   > Cancer Support Group  2nd Tuesday of the month 1pm-2pm, Journey Room   > Creative Journey  3rd Tuesday of the month 1130am-1pm, Journey Room

## 2018-01-28 NOTE — Progress Notes (Signed)
Zoladex injection given per orders. See MAR for details. Patient tolerated it well without problems. Vitals stable and discharged home from clinic ambulatory. Follow up as scheduled.

## 2018-01-29 LAB — ESTRADIOL: Estradiol: 27.3 pg/mL

## 2018-01-29 LAB — LUTEINIZING HORMONE: LH: 0.3 m[IU]/mL

## 2018-01-29 LAB — FOLLICLE STIMULATING HORMONE: FSH: 2.2 m[IU]/mL

## 2018-02-25 ENCOUNTER — Inpatient Hospital Stay (HOSPITAL_COMMUNITY): Payer: BLUE CROSS/BLUE SHIELD | Attending: Hematology

## 2018-02-25 DIAGNOSIS — Z79899 Other long term (current) drug therapy: Secondary | ICD-10-CM | POA: Insufficient documentation

## 2018-02-25 DIAGNOSIS — N912 Amenorrhea, unspecified: Secondary | ICD-10-CM | POA: Insufficient documentation

## 2018-02-25 DIAGNOSIS — C50311 Malignant neoplasm of lower-inner quadrant of right female breast: Secondary | ICD-10-CM | POA: Insufficient documentation

## 2018-02-25 DIAGNOSIS — Z170 Estrogen receptor positive status [ER+]: Secondary | ICD-10-CM | POA: Insufficient documentation

## 2018-03-03 ENCOUNTER — Other Ambulatory Visit: Payer: Self-pay

## 2018-03-03 ENCOUNTER — Encounter (HOSPITAL_COMMUNITY): Payer: Self-pay

## 2018-03-03 ENCOUNTER — Inpatient Hospital Stay (HOSPITAL_COMMUNITY): Payer: BLUE CROSS/BLUE SHIELD

## 2018-03-03 VITALS — BP 145/90 | HR 100 | Temp 98.4°F | Resp 18

## 2018-03-03 DIAGNOSIS — Z79899 Other long term (current) drug therapy: Secondary | ICD-10-CM | POA: Diagnosis not present

## 2018-03-03 DIAGNOSIS — C50911 Malignant neoplasm of unspecified site of right female breast: Secondary | ICD-10-CM

## 2018-03-03 DIAGNOSIS — C50311 Malignant neoplasm of lower-inner quadrant of right female breast: Secondary | ICD-10-CM | POA: Diagnosis not present

## 2018-03-03 DIAGNOSIS — Z170 Estrogen receptor positive status [ER+]: Secondary | ICD-10-CM | POA: Diagnosis not present

## 2018-03-03 DIAGNOSIS — N912 Amenorrhea, unspecified: Secondary | ICD-10-CM | POA: Diagnosis not present

## 2018-03-03 MED ORDER — GOSERELIN ACETATE 3.6 MG ~~LOC~~ IMPL
3.6000 mg | DRUG_IMPLANT | Freq: Once | SUBCUTANEOUS | Status: AC
  Administered 2018-03-03: 3.6 mg via SUBCUTANEOUS
  Filled 2018-03-03: qty 3.6

## 2018-03-03 NOTE — Progress Notes (Signed)
Patient received her Zoladex injection today per MD orders. Patient received injection into left lower abdomen.  Patient tolerated treatment without incidence.  Patient discharged ambulatory and in stable condition from clinic. Patient to follow up as scheduled.

## 2018-03-03 NOTE — Patient Instructions (Signed)
You received your Zoladex injection today per MD orders. Follow up as scheduled.

## 2018-03-18 ENCOUNTER — Other Ambulatory Visit (HOSPITAL_COMMUNITY): Payer: BLUE CROSS/BLUE SHIELD

## 2018-03-25 ENCOUNTER — Ambulatory Visit (HOSPITAL_COMMUNITY): Payer: BLUE CROSS/BLUE SHIELD | Admitting: Internal Medicine

## 2018-03-25 ENCOUNTER — Ambulatory Visit (HOSPITAL_COMMUNITY): Payer: BLUE CROSS/BLUE SHIELD

## 2018-03-26 ENCOUNTER — Other Ambulatory Visit (HOSPITAL_COMMUNITY): Payer: Self-pay

## 2018-03-26 DIAGNOSIS — C50911 Malignant neoplasm of unspecified site of right female breast: Secondary | ICD-10-CM

## 2018-03-26 DIAGNOSIS — K760 Fatty (change of) liver, not elsewhere classified: Secondary | ICD-10-CM

## 2018-04-02 ENCOUNTER — Inpatient Hospital Stay (HOSPITAL_COMMUNITY): Payer: BLUE CROSS/BLUE SHIELD | Attending: Hematology

## 2018-04-02 ENCOUNTER — Inpatient Hospital Stay (HOSPITAL_COMMUNITY): Payer: BLUE CROSS/BLUE SHIELD

## 2018-04-02 ENCOUNTER — Inpatient Hospital Stay (HOSPITAL_COMMUNITY): Payer: BLUE CROSS/BLUE SHIELD | Admitting: Internal Medicine

## 2018-04-02 ENCOUNTER — Encounter (HOSPITAL_COMMUNITY): Payer: Self-pay | Admitting: Internal Medicine

## 2018-04-02 VITALS — BP 147/108 | HR 96 | Temp 98.1°F | Resp 16 | Wt 197.8 lb

## 2018-04-02 DIAGNOSIS — G47419 Narcolepsy without cataplexy: Secondary | ICD-10-CM | POA: Diagnosis not present

## 2018-04-02 DIAGNOSIS — C50911 Malignant neoplasm of unspecified site of right female breast: Secondary | ICD-10-CM

## 2018-04-02 DIAGNOSIS — Z923 Personal history of irradiation: Secondary | ICD-10-CM | POA: Insufficient documentation

## 2018-04-02 DIAGNOSIS — R6882 Decreased libido: Secondary | ICD-10-CM | POA: Diagnosis not present

## 2018-04-02 DIAGNOSIS — R748 Abnormal levels of other serum enzymes: Secondary | ICD-10-CM

## 2018-04-02 DIAGNOSIS — Z170 Estrogen receptor positive status [ER+]: Secondary | ICD-10-CM

## 2018-04-02 DIAGNOSIS — K760 Fatty (change of) liver, not elsewhere classified: Secondary | ICD-10-CM | POA: Diagnosis not present

## 2018-04-02 DIAGNOSIS — F329 Major depressive disorder, single episode, unspecified: Secondary | ICD-10-CM

## 2018-04-02 DIAGNOSIS — N912 Amenorrhea, unspecified: Secondary | ICD-10-CM | POA: Diagnosis not present

## 2018-04-02 DIAGNOSIS — Z9011 Acquired absence of right breast and nipple: Secondary | ICD-10-CM | POA: Diagnosis not present

## 2018-04-02 DIAGNOSIS — Z79899 Other long term (current) drug therapy: Secondary | ICD-10-CM

## 2018-04-02 DIAGNOSIS — R21 Rash and other nonspecific skin eruption: Secondary | ICD-10-CM

## 2018-04-02 DIAGNOSIS — Z800 Family history of malignant neoplasm of digestive organs: Secondary | ICD-10-CM | POA: Diagnosis not present

## 2018-04-02 DIAGNOSIS — Z79810 Long term (current) use of selective estrogen receptor modulators (SERMs): Secondary | ICD-10-CM | POA: Diagnosis not present

## 2018-04-02 LAB — CBC WITH DIFFERENTIAL/PLATELET
BASOS ABS: 0 10*3/uL (ref 0.0–0.1)
BASOS PCT: 1 %
Eosinophils Absolute: 0.1 10*3/uL (ref 0.0–0.7)
Eosinophils Relative: 2 %
HEMATOCRIT: 41.7 % (ref 36.0–46.0)
Hemoglobin: 14.1 g/dL (ref 12.0–15.0)
LYMPHS PCT: 24 %
Lymphs Abs: 1.4 10*3/uL (ref 0.7–4.0)
MCH: 30.7 pg (ref 26.0–34.0)
MCHC: 33.8 g/dL (ref 30.0–36.0)
MCV: 90.7 fL (ref 78.0–100.0)
Monocytes Absolute: 0.5 10*3/uL (ref 0.1–1.0)
Monocytes Relative: 8 %
NEUTROS ABS: 4 10*3/uL (ref 1.7–7.7)
Neutrophils Relative %: 65 %
Platelets: 242 10*3/uL (ref 150–400)
RBC: 4.6 MIL/uL (ref 3.87–5.11)
RDW: 12.8 % (ref 11.5–15.5)
WBC: 6.1 10*3/uL (ref 4.0–10.5)

## 2018-04-02 LAB — COMPREHENSIVE METABOLIC PANEL
ALT: 73 U/L — ABNORMAL HIGH (ref 0–44)
AST: 49 U/L — ABNORMAL HIGH (ref 15–41)
Albumin: 4 g/dL (ref 3.5–5.0)
Alkaline Phosphatase: 96 U/L (ref 38–126)
Anion gap: 10 (ref 5–15)
BUN: 12 mg/dL (ref 6–20)
CO2: 27 mmol/L (ref 22–32)
Calcium: 9.2 mg/dL (ref 8.9–10.3)
Chloride: 105 mmol/L (ref 98–111)
Creatinine, Ser: 0.76 mg/dL (ref 0.44–1.00)
GFR calc Af Amer: >60 mL/min (ref >60)
GFR calc non Af Amer: >60 mL/min (ref >60)
Glucose, Bld: 107 mg/dL — ABNORMAL HIGH (ref 70–99)
Potassium: 3.6 mmol/L (ref 3.5–5.1)
Sodium: 142 mmol/L (ref 135–145)
Total Bilirubin: 0.6 mg/dL (ref 0.3–1.2)
Total Protein: 7.4 g/dL (ref 6.5–8.1)

## 2018-04-02 LAB — LACTATE DEHYDROGENASE: LDH: 179 U/L (ref 98–192)

## 2018-04-02 MED ORDER — GOSERELIN ACETATE 3.6 MG ~~LOC~~ IMPL
3.6000 mg | DRUG_IMPLANT | Freq: Once | SUBCUTANEOUS | Status: DC
  Filled 2018-04-02: qty 3.6

## 2018-04-02 NOTE — Patient Instructions (Signed)
[facility] at [facility] Discharge Instructions  You saw Dr. Higgs today.   Thank you for choosing [facility] at [facility] to provide your oncology and hematology care.  To afford each patient quality time with our provider, please arrive at least 15 minutes before your scheduled appointment time.   If you have a lab appointment with the Cancer Center please come in thru the  Main Entrance and check in at the main information desk  You need to re-schedule your appointment should you arrive 10 or more minutes late.  We strive to give you quality time with our providers, and arriving late affects you and other patients whose appointments are after yours.  Also, if you no show three or more times for appointments you may be dismissed from the clinic at the providers discretion.     Again, thank you for choosing [facility].  Our hope is that these requests will decrease the amount of time that you wait before being seen by our physicians.       _____________________________________________________________  Should you have questions after your visit to  Cancer Center, please contact our office at [phone] between the hours of 8:00 a.m. and 4:30 p.m.  Voicemails left after 4:00 p.m. will not be returned until the following business day.  For prescription refill requests, have your pharmacy contact our office and allow 72 hours.    Cancer Center Support Programs:   > Cancer Support Group  2nd Tuesday of the month 1pm-2pm, Journey Room    

## 2018-04-02 NOTE — Progress Notes (Signed)
Patient continues to take tamoxifen. States she missed 4 doses while at beach. States she continues to have hot flashes and decreased libido. She blames these on the tamoxifen.

## 2018-04-02 NOTE — Progress Notes (Signed)
Diagnosis Invasive ductal carcinoma of breast, female, right ([place]) - Plan: CBC with Differential/Platelet, Comprehensive metabolic panel, Lactate dehydrogenase, Follicle stimulating hormone, Luteinizing hormone, Estradiol  Fatty (change of) liver, not elsewhere classified - Plan: CBC with Differential/Platelet, Comprehensive metabolic panel, Lactate dehydrogenase, Follicle stimulating hormone, Luteinizing hormone, Estradiol  Staging Cancer Staging Invasive ductal carcinoma of breast, female, right ([place]) Staging form: Breast, AJCC 8th Edition - Pathologic stage from 02/01/2017: Stage IA (pT1c, pN0(sn), cM0, G2, ER: Positive, PR: Positive, HER2: Negative, Oncotype DX score: 21) - Signed by Twana First, MD on 02/27/2017 - Clinical: cT1c, cN0, cM0, ER: Positive, PR: Positive, HER2: Negative - Signed by Twana First, MD on 02/27/2017   Assessment and Plan:   1.  Stage IA (pT1c, pN0(sn), cM0, G2, ER: Positive, PR: Positive, HER2: Negative, Oncotype DX score: 21).  Pt was previously followed by Dr. Talbert Cage.  She remains on Tamoxifen.  She was also on monthly Zoladex.  Pt had diagnostic bilateral mammogram and ultrasound done at [facility] on 10/23/2017 with findings of no evidence of malignancy in either breast.  Few oil cysts present.  She should follow up with surgery at [facility] as directed.    Pt is complaining of worsening hormonal symptoms with Zoladex.  It is reasonable due to early stage breast cancer that was node negative, that she be given a trial off Zoladex to determine if symptoms improve.  She should continue Tamoxifen as directed.  She will RTC in 07/2018 for follow-up and labs.  She should notify the clinic if she has any problems prior to her next visit. Next bilateral diagnostic mammogram will be set up for 10/2018.    2.  Amenorrhea an decreased libido.  Likely due to ovarian suppression with Zoladex.  Will hold Zoladex and continue Tamoxifen as directed.  She will have hormone levels checked  on RTC off therapy with FSH, LH and estradiol.     3.  Elevated LFTs.  CT CAP done 01/08/2018 reviewed and showed  IMPRESSION: 1. Status post right lumpectomy and right axillary lymph node dissection. No findings to suggest local recurrence of disease and no metastatic disease identified in the chest, abdomen or pelvis. 2. Severe hepatic steatosis.  Pt has evidence of fatty liver on imaging.  She is not on cholesterol medications.  No further work-up unless LFTs change dramatically.    4.  Hypokalemia.  Resolved.  Labs done 04/02/2018 reviewed and showed WBC 6.1 HB 14.1 plts 242,000.  K+ level WNL on recent labs.     5.  Acneform rash.  This was noted on upper abdomen.  Area is nontender, no vesicles noted.  Keep area clean, use acne wash to area.  Notify if no improvement in skin.    Interval History:   Historical data obtained from the note dated 12/31/2017.  47 year old female previously followed by Dr. Talbert Cage for Stage IA (pT1c, pN0(sn), cM0, G2, ER: Positive, PR: Positive, HER2: Negative, Oncotype DX score: 21).  She remains on Tamoxifen.  She is also on monthly Zoladex for ovarian suppression.      Patient had a mammogram and ultrasound done on 09/25/2016 which showed a suspicious right breast mass at 5:00 position measuring 1.4 cm. 4 mm indeterminate nodule in the right breast at 10:00 position. No evidence of right axillary lymphadenopathy. No evidence of left breast lesions.  Patient was set up for a needle core biopsy of her right breast mass at the 5:00 position. This was done on 10/10/2016 and showed invasive ductal  carcinoma grade 2 and DCIS. Invasive ductal carcinoma was noted to be ER +95%, PR +60%,  HER-2 negative with a Ki-67 of 25%.  Patient reports that a biopsy of the right breast 10:00 lesion was attempted and this was thought to be a cyst.  Right partial mastectomy with reconstruction on 02/01/17. Her final pathology was pT1cN0 ER/PR +, HER2 -.    Current Status: Patient is  here today for follow-up prior to monthly Zoladex.  She reports decreased libido as well as sweats.  She has not had a menstrual cycle.      Invasive ductal carcinoma of breast, female, right ([place])   10/10/2016 Procedure    Needle core biopsy of right breast 5 o'clock position    10/12/2016 Pathology Results    Invasive ductal carcinoma, grade 2 with DCIS.  ER 95% POSITIVE, PR 60% POSITIVE, Ki-67: 25%, HER-2 NEGATIVE.    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    02/01/2017 Surgery    Right partial mastectomy/oncoplastic reduction Final path: pT1c pN0 ER/PR +, HER2 -        02/13/2017 Oncotype testing    Recurrence score of 21    04/11/2017 - 05/29/2017 Radiation Therapy     Total of 29 treatments given at [facility].      Problem List Patient Active Problem List   Diagnosis Date Noted  . Genetic testing [Z13.79] 12/10/2016  . Invasive ductal carcinoma of breast, female, right [facility]) [C50.911] 10/17/2016    Past Medical History Past Medical History:  Diagnosis Date  . Breast cancer ([place])    right  . Depression   . Invasive ductal carcinoma of breast, female, right ([place]) 10/17/2016  . Narcolepsy     Past Surgical History Past Surgical History:  Procedure Laterality Date  . CESAREAN SECTION  2000,2005  . CHOLECYSTECTOMY      Family History Family History  Problem Relation Age of Onset  . Cancer Maternal Grandmother 37       appendiceal with metastases d.72s  . Cancer Paternal Grandfather 45       laryngeal     Social History  reports that she has never smoked. She has never used smokeless tobacco. She reports that she does not drink alcohol or use drugs.  Medications  Current Outpatient Medications:  .  amphetamine-dextroamphetamine (ADDERALL) 30 MG tablet, , Disp: , Rfl:  .  DULoxetine (CYMBALTA) 60 MG  capsule, , Disp: , Rfl:  .  SUMAtriptan (IMITREX) 100 MG tablet, , Disp: , Rfl:  .  tamoxifen (NOLVADEX) 20 MG tablet, , Disp: , Rfl:  .  gabapentin (NEURONTIN) 300 MG capsule, Take 1 capsule (300 mg total) by mouth 2 (two) times daily. (Patient not taking: Reported on 04/02/2018), Disp: 60 capsule, Rfl: 2 No current facility-administered medications for this visit.   Facility-Administered Medications Ordered in Other Visits:  .  goserelin (ZOLADEX) injection 3.6 mg, 3.6 mg, Subcutaneous, Once, Sophiana Milanese, Mathis Dad, MD  Allergies Bee venom and Wellbutrin [bupropion]  Review of Systems Review of Systems - Oncology ROS negative other than decreased libido   Physical Exam  Vitals Wt Readings from Last 3 Encounters:  04/02/18 197 lb 12.8 oz (89.7 kg)  12/31/17 198 lb 3.2 oz (89.9 kg)  10/03/17 196 lb (88.9 kg)   Temp Readings from Last 3 Encounters:  04/02/18 98.1 F (36.7 C) (Oral)  03/03/18 98.4  F (36.9 C) (Oral)  01/28/18 98.6 F (37 C) (Oral)   BP Readings from Last 3 Encounters:  04/02/18 (!) 147/108  03/03/18 (!) 145/90  01/28/18 (!) 141/99   Pulse Readings from Last 3 Encounters:  04/02/18 96  03/03/18 100  01/28/18 88   Constitutional: Well-developed, well-nourished, and in no distress.   HENT: Head: Normocephalic and atraumatic.  Mouth/Throat: No oropharyngeal exudate. Mucosa moist. Eyes: Pupils are equal, round, and reactive to light. Conjunctivae are normal. No scleral icterus.  Neck: Normal range of motion. Neck supple. No JVD present.  Cardiovascular: Normal rate, regular rhythm and normal heart sounds.  Exam reveals no gallop and no friction rub.   No murmur heard. Pulmonary/Chest: Effort normal and breath sounds normal. No respiratory distress. No wheezes.No rales.  Abdominal: Soft. Bowel sounds are normal. No distension. There is no tenderness. There is no guarding.  Musculoskeletal: No edema or tenderness.  Lymphadenopathy: No cervical, axillary or  supraclavicular adenopathy.  Neurological: Alert and oriented to person, place, and time. No cranial nerve deficit.  Skin: Skin is warm and dry. No erythema. No pallor. Acneform rash noted on upper abdomen.  NT.   Psychiatric: Affect and judgment normal.  Breast exam:  Surgical scars noted both breasts.  No palpable dominant masses noted bilaterally.    Labs Appointment on 04/02/2018  Component Date Value Ref Range Status  . WBC 04/02/2018 6.1  4.0 - 10.5 K/uL Final  . RBC 04/02/2018 4.60  3.87 - 5.11 MIL/uL Final  . Hemoglobin 04/02/2018 14.1  12.0 - 15.0 g/dL Final  . HCT 04/02/2018 41.7  36.0 - 46.0 % Final  . MCV 04/02/2018 90.7  78.0 - 100.0 fL Final  . MCH 04/02/2018 30.7  26.0 - 34.0 pg Final  . MCHC 04/02/2018 33.8  30.0 - 36.0 g/dL Final  . RDW 04/02/2018 12.8  11.5 - 15.5 % Final  . Platelets 04/02/2018 242  150 - 400 K/uL Final  . Neutrophils Relative % 04/02/2018 65  % Final  . Neutro Abs 04/02/2018 4.0  1.7 - 7.7 K/uL Final  . Lymphocytes Relative 04/02/2018 24  % Final  . Lymphs Abs 04/02/2018 1.4  0.7 - 4.0 K/uL Final  . Monocytes Relative 04/02/2018 8  % Final  . Monocytes Absolute 04/02/2018 0.5  0.1 - 1.0 K/uL Final  . Eosinophils Relative 04/02/2018 2  % Final  . Eosinophils Absolute 04/02/2018 0.1  0.0 - 0.7 K/uL Final  . Basophils Relative 04/02/2018 1  % Final  . Basophils Absolute 04/02/2018 0.0  0.0 - 0.1 K/uL Final   Performed at [facility], [address] [place], [place] [address]  . Sodium 04/02/2018 142  135 - 145 mmol/L Final  . Potassium 04/02/2018 3.6  3.5 - 5.1 mmol/L Final  . Chloride 04/02/2018 105  98 - 111 mmol/L Final  . CO2 04/02/2018 27  22 - 32 mmol/L Final  . Glucose, Bld 04/02/2018 107* 70 - 99 mg/dL Final  . BUN 04/02/2018 12  6 - 20 mg/dL Final  . Creatinine, Ser 04/02/2018 0.76  0.44 - 1.00 mg/dL Final  . Calcium 04/02/2018 9.2  8.9 - 10.3 mg/dL Final  . Total Protein 04/02/2018 7.4  6.5 - 8.1 g/dL Final  . Albumin 04/02/2018 4.0   3.5 - 5.0 g/dL Final  . AST 04/02/2018 49* 15 - 41 U/L Final  . ALT 04/02/2018 73* 0 - 44 U/L Final  . Alkaline Phosphatase 04/02/2018 96  38 - 126 U/L Final  .  Total Bilirubin 04/02/2018 0.6  0.3 - 1.2 mg/dL Final  . GFR calc non Af Amer 04/02/2018 >60  >60 mL/min Final  . GFR calc Af Amer 04/02/2018 >60  >60 mL/min Final   Comment: (NOTE) The eGFR has been calculated using the CKD EPI equation. This calculation has not been validated in all clinical situations. eGFR's persistently <60 mL/min signify possible Chronic Kidney Disease.   Georgiann Hahn gap 04/02/2018 10  5 - 15 Final   Performed at [facility], [address], [place], [place] [address]  . LDH 04/02/2018 179  98 - 192 U/L Final   Performed at [facility], [address], [place], [place] [address]     Pathology Orders Placed This Encounter  Procedures  . CBC with Differential/Platelet    Standing Status:   Future    Standing Expiration Date:   04/03/2019  . Comprehensive metabolic panel    Standing Status:   Future    Standing Expiration Date:   04/03/2019  . Lactate dehydrogenase    Standing Status:   Future    Standing Expiration Date:   04/03/2019  . Follicle stimulating hormone    Standing Status:   Future    Standing Expiration Date:   04/03/2019  . Luteinizing hormone    Standing Status:   Future    Standing Expiration Date:   04/03/2019  . Estradiol    Standing Status:   Future    Standing Expiration Date:   04/03/2019       Zoila Shutter MD

## 2018-04-03 NOTE — Progress Notes (Signed)
Patient did not receive Zoladex injection on 04/02/18 per Dr. Walden Field orders.

## 2018-04-16 DIAGNOSIS — G47419 Narcolepsy without cataplexy: Secondary | ICD-10-CM | POA: Diagnosis not present

## 2018-04-16 DIAGNOSIS — E8881 Metabolic syndrome: Secondary | ICD-10-CM | POA: Diagnosis not present

## 2018-04-16 DIAGNOSIS — F900 Attention-deficit hyperactivity disorder, predominantly inattentive type: Secondary | ICD-10-CM | POA: Diagnosis not present

## 2018-04-16 DIAGNOSIS — F331 Major depressive disorder, recurrent, moderate: Secondary | ICD-10-CM | POA: Diagnosis not present

## 2018-04-16 DIAGNOSIS — Z23 Encounter for immunization: Secondary | ICD-10-CM | POA: Diagnosis not present

## 2018-07-09 ENCOUNTER — Other Ambulatory Visit (HOSPITAL_COMMUNITY): Payer: BLUE CROSS/BLUE SHIELD

## 2018-07-16 ENCOUNTER — Encounter (HOSPITAL_COMMUNITY): Payer: Self-pay | Admitting: Internal Medicine

## 2018-07-16 ENCOUNTER — Inpatient Hospital Stay (HOSPITAL_COMMUNITY): Payer: BLUE CROSS/BLUE SHIELD

## 2018-07-16 ENCOUNTER — Inpatient Hospital Stay (HOSPITAL_COMMUNITY): Payer: BLUE CROSS/BLUE SHIELD | Attending: Internal Medicine | Admitting: Internal Medicine

## 2018-07-16 VITALS — BP 128/89 | HR 103 | Temp 98.4°F | Resp 18 | Wt 194.1 lb

## 2018-07-16 DIAGNOSIS — R6882 Decreased libido: Secondary | ICD-10-CM | POA: Insufficient documentation

## 2018-07-16 DIAGNOSIS — Z79810 Long term (current) use of selective estrogen receptor modulators (SERMs): Secondary | ICD-10-CM | POA: Diagnosis not present

## 2018-07-16 DIAGNOSIS — R7989 Other specified abnormal findings of blood chemistry: Secondary | ICD-10-CM | POA: Diagnosis not present

## 2018-07-16 DIAGNOSIS — C50411 Malignant neoplasm of upper-outer quadrant of right female breast: Secondary | ICD-10-CM | POA: Insufficient documentation

## 2018-07-16 DIAGNOSIS — Z170 Estrogen receptor positive status [ER+]: Secondary | ICD-10-CM | POA: Insufficient documentation

## 2018-07-16 DIAGNOSIS — C50911 Malignant neoplasm of unspecified site of right female breast: Secondary | ICD-10-CM

## 2018-07-16 DIAGNOSIS — Z79899 Other long term (current) drug therapy: Secondary | ICD-10-CM | POA: Diagnosis not present

## 2018-07-16 DIAGNOSIS — Z923 Personal history of irradiation: Secondary | ICD-10-CM | POA: Diagnosis not present

## 2018-07-16 DIAGNOSIS — M2550 Pain in unspecified joint: Secondary | ICD-10-CM | POA: Diagnosis not present

## 2018-07-16 DIAGNOSIS — N912 Amenorrhea, unspecified: Secondary | ICD-10-CM | POA: Insufficient documentation

## 2018-07-16 DIAGNOSIS — M25559 Pain in unspecified hip: Secondary | ICD-10-CM

## 2018-07-16 DIAGNOSIS — K760 Fatty (change of) liver, not elsewhere classified: Secondary | ICD-10-CM

## 2018-07-16 LAB — CBC WITH DIFFERENTIAL/PLATELET
Abs Immature Granulocytes: 0.02 10*3/uL (ref 0.00–0.07)
BASOS PCT: 1 %
Basophils Absolute: 0 10*3/uL (ref 0.0–0.1)
EOS PCT: 3 %
Eosinophils Absolute: 0.2 10*3/uL (ref 0.0–0.5)
HEMATOCRIT: 37.7 % (ref 36.0–46.0)
HEMOGLOBIN: 12.6 g/dL (ref 12.0–15.0)
Immature Granulocytes: 0 %
LYMPHS ABS: 1.3 10*3/uL (ref 0.7–4.0)
Lymphocytes Relative: 22 %
MCH: 30.5 pg (ref 26.0–34.0)
MCHC: 33.4 g/dL (ref 30.0–36.0)
MCV: 91.3 fL (ref 80.0–100.0)
MONO ABS: 0.4 10*3/uL (ref 0.1–1.0)
Monocytes Relative: 7 %
NEUTROS PCT: 67 %
Neutro Abs: 4.1 10*3/uL (ref 1.7–7.7)
Platelets: 296 10*3/uL (ref 150–400)
RBC: 4.13 MIL/uL (ref 3.87–5.11)
RDW: 12.7 % (ref 11.5–15.5)
WBC: 6 10*3/uL (ref 4.0–10.5)
nRBC: 0 % (ref 0.0–0.2)

## 2018-07-16 LAB — COMPREHENSIVE METABOLIC PANEL
ALT: 50 U/L — ABNORMAL HIGH (ref 0–44)
ANION GAP: 9 (ref 5–15)
AST: 35 U/L (ref 15–41)
Albumin: 3.9 g/dL (ref 3.5–5.0)
Alkaline Phosphatase: 80 U/L (ref 38–126)
BUN: 10 mg/dL (ref 6–20)
CO2: 25 mmol/L (ref 22–32)
Calcium: 8.7 mg/dL — ABNORMAL LOW (ref 8.9–10.3)
Chloride: 106 mmol/L (ref 98–111)
Creatinine, Ser: 0.79 mg/dL (ref 0.44–1.00)
GFR calc non Af Amer: >60 mL/min (ref >60)
Glucose, Bld: 125 mg/dL — ABNORMAL HIGH (ref 70–99)
POTASSIUM: 3.5 mmol/L (ref 3.5–5.1)
SODIUM: 140 mmol/L (ref 135–145)
Total Bilirubin: 0.8 mg/dL (ref 0.3–1.2)
Total Protein: 7 g/dL (ref 6.5–8.1)

## 2018-07-16 LAB — LACTATE DEHYDROGENASE: LDH: 164 U/L (ref 98–192)

## 2018-07-16 NOTE — Progress Notes (Signed)
Diagnosis Invasive ductal carcinoma of breast, female, right ([place]) - Plan: MM Digital Diagnostic Bilat, CBC with Differential/Platelet, Comprehensive metabolic panel, Lactate dehydrogenase, Rheumatoid factor, ANA, IFA (with reflex), Protein electrophoresis, serum, Sedimentation rate, C-reactive protein  Pain in joint involving pelvic region and thigh, unspecified laterality - Plan: MM Digital Diagnostic Bilat, CBC with Differential/Platelet, Comprehensive metabolic panel, Lactate dehydrogenase, Rheumatoid factor, ANA, IFA (with reflex), Protein electrophoresis, serum, Sedimentation rate, C-reactive protein  Staging Cancer Staging Invasive ductal carcinoma of breast, female, right ([facility]) Staging form: Breast, AJCC 8th Edition - Pathologic stage from 02/01/2017: Stage IA (pT1c, pN0(sn), cM0, G2, ER: Positive, PR: Positive, HER2: Negative, Oncotype DX score: 21) - Signed by Twana First, MD on 02/27/2017 - Clinical: cT1c, cN0, cM0, ER: Positive, PR: Positive, HER2: Negative - Signed by Twana First, MD on 02/27/2017   Assessment and Plan:  1.  Stage IA (pT1c, pN0(sn), cM0, G2, ER: Positive, PR: Positive, HER2: Negative, Oncotype DX score: 21).  Pt was previously followed by Dr. Talbert Cage.  She remains on Tamoxifen.  She was on monthly Zoladex.  Pt had diagnostic bilateral mammogram and ultrasound done at [facility] on 10/23/2017 with findings of no evidence of malignancy in either breast.  Few oil cysts present.  She should follow up with surgery at [facility] as directed.    Pt was complaining of worsening hormonal symptoms with Zoladex.  It was felt reasonable due to early stage breast cancer that was node negative, that she be given a trial off Zoladex to determine if symptoms improve.  Last Zoladex was in 02/2018.  She feels symptoms have improved off Zoladex.  She will continue Tamoxifen as directed.  Rx 90 with 4 refills sent to pharmacy.  She will RTC in 01/2019 for follow-up and labs.  She should notify the  clinic if she has any problems prior to her next visit. Next bilateral diagnostic mammogram due in 09/2018.  Pt has outside mammogram imaging done in [place].    2.  Amenorrhea an decreased libido.  Likely due to ovarian suppression with Zoladex.  Pt felt some symptoms improved since she has been off Zoladex.  She reports LMP was in the summer after missing doses of Tamoxifen.  Pt should continue Tamoxifen as directed.  Will check FSH, LH and estradiol on RTC in 01/2019.    3.  Joint pain.  Pt describes this as stiffness.  She was given option of bone scan but does not desire imaging.  She will have RF, ANA, CRP, Sed rate and SPEP checked on RTC.  Will notify office if no improvement in symptoms.    4.  Elevated LFTs.  CT CAP done 01/08/2018 reviewed and showed  IMPRESSION: 1. Status post right lumpectomy and right axillary lymph node dissection. No findings to suggest local recurrence of disease and no metastatic disease identified in the chest, abdomen or pelvis. 2. Severe hepatic steatosis.  Pt has evidence of fatty liver on imaging.  She is not on cholesterol medications.  No further work-up unless LFTs change dramatically.  Labs done 07/16/2018 reviewed and showed WBC 6 HB 12.6 plts 296,000.  Chemistries WNL with K+ 3.5 Cr 0.79.  Pt has elevated ALT of 50 which is improved from labs done 03/2018.    5.  Acneform scarring.  This was noted on upper abdomen.    25 minutes spent with more than 50% spent in counseling and coordination of care.    Interval History:   Historical data obtained from the note dated  12/31/2017.  47 year old female previously followed by Dr. Talbert Cage for Stage IA (pT1c, pN0(sn), cM0, G2, ER: Positive, PR: Positive, HER2: Negative, Oncotype DX score: 21).  She remains on Tamoxifen.  She is also on monthly Zoladex for ovarian suppression.      Patient had a mammogram and ultrasound done on 09/25/2016 which showed a suspicious right breast mass at 5:00 position measuring 1.4 cm. 4 mm  indeterminate nodule in the right breast at 10:00 position. No evidence of right axillary lymphadenopathy. No evidence of left breast lesions.  Patient was set up for a needle core biopsy of her right breast mass at the 5:00 position. This was done on 10/10/2016 and showed invasive ductal carcinoma grade 2 and DCIS. Invasive ductal carcinoma was noted to be ER +95%, PR +60%,  HER-2 negative with a Ki-67 of 25%.  Patient reports that a biopsy of the right breast 10:00 lesion was attempted and this was thought to be a cyst.  Right partial mastectomy with reconstruction on 02/01/17. Her final pathology was pT1cN0 ER/PR +, HER2 -.    Current Status: Patient is here today for follow-up.  She reports symptoms have improved since stopping Zoladex.  She reports joint stiffness.  LMP was in summer after missing some doses of Tamoxifen.     Invasive ductal carcinoma of breast, female, right ([place])   10/10/2016 Procedure    Needle core biopsy of right breast 5 o'clock position    10/12/2016 Pathology Results    Invasive ductal carcinoma, grade 2 with DCIS.  ER 95% POSITIVE, PR 60% POSITIVE, Ki-67: 25%, HER-2 NEGATIVE.    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    02/01/2017 Surgery    Right partial mastectomy/oncoplastic reduction Final path: pT1c pN0 ER/PR +, HER2 -        02/13/2017 Oncotype testing    Recurrence score of 21    04/11/2017 - 05/29/2017 Radiation Therapy     Total of 29 treatments given at [facility].      Problem List Patient Active Problem List   Diagnosis Date Noted  . Genetic testing [Z13.79] 12/10/2016  . Invasive ductal carcinoma of breast, female, right [facility]) [C50.911] 10/17/2016    Past Medical History Past Medical History:  Diagnosis Date  . Breast cancer ([place])    right  . Depression   . Invasive ductal  carcinoma of breast, female, right ([place]) 10/17/2016  . Narcolepsy     Past Surgical History Past Surgical History:  Procedure Laterality Date  . CESAREAN SECTION  2000,2005  . CHOLECYSTECTOMY      Family History Family History  Problem Relation Age of Onset  . Cancer Maternal Grandmother 64       appendiceal with metastases d.72s  . Cancer Paternal Grandfather 25       laryngeal     Social History  reports that she has never smoked. She has never used smokeless tobacco. She reports that she does not drink alcohol or use drugs.  Medications  Current Outpatient Medications:  .  amphetamine-dextroamphetamine (ADDERALL) 30 MG tablet, Take 30 mg by mouth 3 (three) times daily. , Disp: , Rfl:  .  DULoxetine (CYMBALTA) 60 MG capsule, Take 60 mg by mouth 2 (two) times daily. , Disp: , Rfl:  .  gabapentin (NEURONTIN) 300 MG capsule, Take 1 capsule (300 mg total) by mouth 2 (two) times  daily. (Patient not taking: Reported on 04/02/2018), Disp: 60 capsule, Rfl: 2 .  SUMAtriptan (IMITREX) 100 MG tablet, Take 100 mg by mouth every 2 (two) hours as needed. , Disp: , Rfl:  .  tamoxifen (NOLVADEX) 20 MG tablet, Take 20 mg by mouth daily. , Disp: , Rfl:   Allergies Bee venom and Wellbutrin [bupropion]  Review of Systems Review of Systems - Oncology ROS negative other than joint stiffness   Physical Exam  Vitals Wt Readings from Last 3 Encounters:  07/16/18 194 lb 1.6 oz (88 kg)  04/02/18 197 lb 12.8 oz (89.7 kg)  12/31/17 198 lb 3.2 oz (89.9 kg)   Temp Readings from Last 3 Encounters:  07/16/18 98.4 F (36.9 C) (Oral)  04/02/18 98.1 F (36.7 C) (Oral)  03/03/18 98.4 F (36.9 C) (Oral)   BP Readings from Last 3 Encounters:  07/16/18 128/89  04/02/18 (!) 147/108  03/03/18 (!) 145/90   Pulse Readings from Last 3 Encounters:  07/16/18 (!) 103  04/02/18 96  03/03/18 100   Constitutional: Well-developed, well-nourished, and in no distress.   HENT: Head: Normocephalic and  atraumatic.  Mouth/Throat: No oropharyngeal exudate. Mucosa moist. Eyes: Pupils are equal, round, and reactive to light. Conjunctivae are normal. No scleral icterus.  Neck: Normal range of motion. Neck supple. No JVD present.  Cardiovascular: Normal rate, regular rhythm and normal heart sounds.  Exam reveals no gallop and no friction rub.   No murmur heard. Pulmonary/Chest: Effort normal and breath sounds normal. No respiratory distress. No wheezes.No rales.  Abdominal: Soft. Bowel sounds are normal. No distension. There is no tenderness. There is no guarding.  Musculoskeletal: No edema or tenderness.  Lymphadenopathy: No cervical, axillary or supraclavicular adenopathy.  Neurological: Alert and oriented to person, place, and time. No cranial nerve deficit.  Skin: Skin is warm and dry. No rash noted. No erythema. No pallor. Acne scarring noted on upper abdomen.   Psychiatric: Affect and judgment normal.  Breast exam:  Chaperone present.  Surgical scars noted bilaterally.  No dominant masses palpable bilaterally.    Labs Appointment on 07/16/2018  Component Date Value Ref Range Status  . WBC 07/16/2018 6.0  4.0 - 10.5 K/uL Final  . RBC 07/16/2018 4.13  3.87 - 5.11 MIL/uL Final  . Hemoglobin 07/16/2018 12.6  12.0 - 15.0 g/dL Final  . HCT 07/16/2018 37.7  36.0 - 46.0 % Final  . MCV 07/16/2018 91.3  80.0 - 100.0 fL Final  . MCH 07/16/2018 30.5  26.0 - 34.0 pg Final  . MCHC 07/16/2018 33.4  30.0 - 36.0 g/dL Final  . RDW 07/16/2018 12.7  11.5 - 15.5 % Final  . Platelets 07/16/2018 296  150 - 400 K/uL Final  . nRBC 07/16/2018 0.0  0.0 - 0.2 % Final  . Neutrophils Relative % 07/16/2018 67  % Final  . Neutro Abs 07/16/2018 4.1  1.7 - 7.7 K/uL Final  . Lymphocytes Relative 07/16/2018 22  % Final  . Lymphs Abs 07/16/2018 1.3  0.7 - 4.0 K/uL Final  . Monocytes Relative 07/16/2018 7  % Final  . Monocytes Absolute 07/16/2018 0.4  0.1 - 1.0 K/uL Final  . Eosinophils Relative 07/16/2018 3  % Final   . Eosinophils Absolute 07/16/2018 0.2  0.0 - 0.5 K/uL Final  . Basophils Relative 07/16/2018 1  % Final  . Basophils Absolute 07/16/2018 0.0  0.0 - 0.1 K/uL Final  . Immature Granulocytes 07/16/2018 0  % Final  . Abs Immature Granulocytes 07/16/2018 0.02  0.00 - 0.07 K/uL Final   Performed at [facility], [address], [place], [place] [address]  . Sodium 07/16/2018 140  135 - 145 mmol/L Final  . Potassium 07/16/2018 3.5  3.5 - 5.1 mmol/L Final  . Chloride 07/16/2018 106  98 - 111 mmol/L Final  . CO2 07/16/2018 25  22 - 32 mmol/L Final  . Glucose, Bld 07/16/2018 125* 70 - 99 mg/dL Final  . BUN 07/16/2018 10  6 - 20 mg/dL Final  . Creatinine, Ser 07/16/2018 0.79  0.44 - 1.00 mg/dL Final  . Calcium 07/16/2018 8.7* 8.9 - 10.3 mg/dL Final  . Total Protein 07/16/2018 7.0  6.5 - 8.1 g/dL Final  . Albumin 07/16/2018 3.9  3.5 - 5.0 g/dL Final  . AST 07/16/2018 35  15 - 41 U/L Final  . ALT 07/16/2018 50* 0 - 44 U/L Final  . Alkaline Phosphatase 07/16/2018 80  38 - 126 U/L Final  . Total Bilirubin 07/16/2018 0.8  0.3 - 1.2 mg/dL Final  . GFR calc non Af Amer 07/16/2018 >60  >60 mL/min Final  . GFR calc Af Amer 07/16/2018 >60  >60 mL/min Final  . Anion gap 07/16/2018 9  5 - 15 Final   Performed at [facility], [address], [place], [place] [address]  . LDH 07/16/2018 164  98 - 192 U/L Final   Performed at [facility], [address] [place], [place] [address]     Pathology Orders Placed This Encounter  Procedures  . MM Digital Diagnostic Bilat    Standing Status:   Future    Standing Expiration Date:   07/16/2019    Order Specific Question:   Reason for Exam (SYMPTOM  OR DIAGNOSIS REQUIRED)    Answer:   breast cancer    Order Specific Question:   Is the patient pregnant?    Answer:   No    Order Specific Question:   Preferred imaging location?    Answer:   External  . CBC with Differential/Platelet    Standing Status:   Future    Standing Expiration Date:   07/16/2020  .  Comprehensive metabolic panel    Standing Status:   Future    Standing Expiration Date:   07/16/2020  . Lactate dehydrogenase    Standing Status:   Future    Standing Expiration Date:   07/16/2020  . Rheumatoid factor    Standing Status:   Future    Standing Expiration Date:   07/16/2020  . ANA, IFA (with reflex)    Standing Status:   Future    Standing Expiration Date:   07/16/2020  . Protein electrophoresis, serum    Standing Status:   Future    Standing Expiration Date:   07/16/2020  . Sedimentation rate    Standing Status:   Future    Standing Expiration Date:   07/16/2020  . C-reactive protein    Standing Status:   Future    Standing Expiration Date:   07/16/2020       Zoila Shutter MD

## 2018-07-16 NOTE — Patient Instructions (Signed)
[facility] at [facility] Discharge Instructions  You were seen by Dr. Higgs today   Thank you for choosing  Cancer Center at [facility] to provide your oncology and hematology care.  To afford each patient quality time with our provider, please arrive at least 15 minutes before your scheduled appointment time.   If you have a lab appointment with the Cancer Center please come in thru the  Main Entrance and check in at the main information desk  You need to re-schedule your appointment should you arrive 10 or more minutes late.  We strive to give you quality time with our providers, and arriving late affects you and other patients whose appointments are after yours.  Also, if you no show three or more times for appointments you may be dismissed from the clinic at the providers discretion.     Again, thank you for choosing [facility].  Our hope is that these requests will decrease the amount of time that you wait before being seen by our physicians.       _____________________________________________________________  Should you have questions after your visit to [facility], please contact our office at [phone] between the hours of 8:00 a.m. and 4:30 p.m.  Voicemails left after 4:00 p.m. will not be returned until the following business day.  For prescription refill requests, have your pharmacy contact our office and allow 72 hours.    Cancer Center Support Programs:   > Cancer Support Group  2nd Tuesday of the month 1pm-2pm, Journey Room   

## 2018-07-17 LAB — FOLLICLE STIMULATING HORMONE: FSH: 9.6 m[IU]/mL

## 2018-07-17 LAB — ESTRADIOL: Estradiol: 96.9 pg/mL

## 2018-07-17 LAB — LUTEINIZING HORMONE: LH: 7.2 m[IU]/mL

## 2018-07-22 DIAGNOSIS — E8881 Metabolic syndrome: Secondary | ICD-10-CM | POA: Diagnosis not present

## 2018-07-22 DIAGNOSIS — G47419 Narcolepsy without cataplexy: Secondary | ICD-10-CM | POA: Diagnosis not present

## 2018-07-22 DIAGNOSIS — F900 Attention-deficit hyperactivity disorder, predominantly inattentive type: Secondary | ICD-10-CM | POA: Diagnosis not present

## 2018-07-22 DIAGNOSIS — F331 Major depressive disorder, recurrent, moderate: Secondary | ICD-10-CM | POA: Diagnosis not present

## 2018-10-16 DIAGNOSIS — Z1331 Encounter for screening for depression: Secondary | ICD-10-CM | POA: Diagnosis not present

## 2018-10-16 DIAGNOSIS — F900 Attention-deficit hyperactivity disorder, predominantly inattentive type: Secondary | ICD-10-CM | POA: Diagnosis not present

## 2018-10-16 DIAGNOSIS — Z1389 Encounter for screening for other disorder: Secondary | ICD-10-CM | POA: Diagnosis not present

## 2018-10-16 DIAGNOSIS — E8881 Metabolic syndrome: Secondary | ICD-10-CM | POA: Diagnosis not present

## 2018-10-16 DIAGNOSIS — F331 Major depressive disorder, recurrent, moderate: Secondary | ICD-10-CM | POA: Diagnosis not present

## 2018-10-16 DIAGNOSIS — C50511 Malignant neoplasm of lower-outer quadrant of right female breast: Secondary | ICD-10-CM | POA: Diagnosis not present

## 2019-01-14 ENCOUNTER — Other Ambulatory Visit (HOSPITAL_COMMUNITY): Payer: BLUE CROSS/BLUE SHIELD

## 2019-01-21 ENCOUNTER — Inpatient Hospital Stay (HOSPITAL_COMMUNITY): Payer: BC Managed Care – PPO | Attending: Internal Medicine

## 2019-01-21 ENCOUNTER — Ambulatory Visit (HOSPITAL_COMMUNITY): Payer: BLUE CROSS/BLUE SHIELD | Admitting: Hematology

## 2019-01-21 ENCOUNTER — Other Ambulatory Visit: Payer: Self-pay

## 2019-01-21 DIAGNOSIS — C50911 Malignant neoplasm of unspecified site of right female breast: Secondary | ICD-10-CM

## 2019-01-21 DIAGNOSIS — Z170 Estrogen receptor positive status [ER+]: Secondary | ICD-10-CM | POA: Diagnosis not present

## 2019-01-21 DIAGNOSIS — M25559 Pain in unspecified hip: Secondary | ICD-10-CM

## 2019-01-21 LAB — CBC WITH DIFFERENTIAL/PLATELET
Abs Immature Granulocytes: 0.03 10*3/uL (ref 0.00–0.07)
Basophils Absolute: 0.1 10*3/uL (ref 0.0–0.1)
Basophils Relative: 1 %
Eosinophils Absolute: 0.1 10*3/uL (ref 0.0–0.5)
Eosinophils Relative: 2 %
HCT: 40.8 % (ref 36.0–46.0)
Hemoglobin: 13 g/dL (ref 12.0–15.0)
Immature Granulocytes: 0 %
Lymphocytes Relative: 16 %
Lymphs Abs: 1.2 10*3/uL (ref 0.7–4.0)
MCH: 27.8 pg (ref 26.0–34.0)
MCHC: 31.9 g/dL (ref 30.0–36.0)
MCV: 87.4 fL (ref 80.0–100.0)
Monocytes Absolute: 0.5 10*3/uL (ref 0.1–1.0)
Monocytes Relative: 6 %
Neutro Abs: 5.4 10*3/uL (ref 1.7–7.7)
Neutrophils Relative %: 75 %
Platelets: 325 10*3/uL (ref 150–400)
RBC: 4.67 MIL/uL (ref 3.87–5.11)
RDW: 13.5 % (ref 11.5–15.5)
WBC: 7.2 10*3/uL (ref 4.0–10.5)
nRBC: 0 % (ref 0.0–0.2)

## 2019-01-21 LAB — COMPREHENSIVE METABOLIC PANEL
ALT: 57 U/L — ABNORMAL HIGH (ref 0–44)
AST: 56 U/L — ABNORMAL HIGH (ref 15–41)
Albumin: 3.9 g/dL (ref 3.5–5.0)
Alkaline Phosphatase: 92 U/L (ref 38–126)
Anion gap: 10 (ref 5–15)
BUN: 10 mg/dL (ref 6–20)
CO2: 26 mmol/L (ref 22–32)
Calcium: 8.9 mg/dL (ref 8.9–10.3)
Chloride: 105 mmol/L (ref 98–111)
Creatinine, Ser: 0.71 mg/dL (ref 0.44–1.00)
GFR calc Af Amer: >60 mL/min (ref >60)
GFR calc non Af Amer: >60 mL/min (ref >60)
Glucose, Bld: 114 mg/dL — ABNORMAL HIGH (ref 70–99)
Potassium: 3.6 mmol/L (ref 3.5–5.1)
Sodium: 141 mmol/L (ref 135–145)
Total Bilirubin: 0.8 mg/dL (ref 0.3–1.2)
Total Protein: 7.1 g/dL (ref 6.5–8.1)

## 2019-01-21 LAB — LACTATE DEHYDROGENASE: LDH: 166 U/L (ref 98–192)

## 2019-01-21 LAB — C-REACTIVE PROTEIN: CRP: <0.8 mg/dL (ref <1.0)

## 2019-01-21 LAB — SEDIMENTATION RATE: Sed Rate: 5 mm/hr (ref 0–22)

## 2019-01-21 NOTE — Progress Notes (Signed)
For review.  Please update ordering provider

## 2019-01-22 LAB — PROTEIN ELECTROPHORESIS, SERUM
A/G Ratio: 1.3 (ref 0.7–1.7)
Albumin ELP: 3.8 g/dL (ref 2.9–4.4)
Alpha-1-Globulin: 0.3 g/dL (ref 0.0–0.4)
Alpha-2-Globulin: 0.7 g/dL (ref 0.4–1.0)
Beta Globulin: 1.2 g/dL (ref 0.7–1.3)
Gamma Globulin: 0.8 g/dL (ref 0.4–1.8)
Globulin, Total: 3 g/dL (ref 2.2–3.9)
Total Protein ELP: 6.8 g/dL (ref 6.0–8.5)

## 2019-01-22 LAB — RHEUMATOID FACTOR: Rhuematoid fact SerPl-aCnc: <10 IU/mL (ref 0.0–13.9)

## 2019-01-22 LAB — ANTINUCLEAR ANTIBODIES, IFA: ANA Ab, IFA: NEGATIVE

## 2019-01-22 NOTE — Progress Notes (Signed)
For review.  Please update ordering provider

## 2019-01-28 ENCOUNTER — Ambulatory Visit (HOSPITAL_COMMUNITY): Payer: BLUE CROSS/BLUE SHIELD | Admitting: Hematology

## 2019-02-23 ENCOUNTER — Other Ambulatory Visit (HOSPITAL_COMMUNITY): Payer: Self-pay | Admitting: Hematology

## 2019-02-23 ENCOUNTER — Other Ambulatory Visit: Payer: Self-pay

## 2019-02-23 ENCOUNTER — Encounter (HOSPITAL_COMMUNITY): Payer: Self-pay | Admitting: Hematology

## 2019-02-23 ENCOUNTER — Inpatient Hospital Stay (HOSPITAL_COMMUNITY): Payer: BC Managed Care – PPO | Attending: Internal Medicine | Admitting: Hematology

## 2019-02-23 VITALS — BP 137/96 | HR 97 | Temp 98.2°F | Resp 16 | Wt 195.2 lb

## 2019-02-23 DIAGNOSIS — Z170 Estrogen receptor positive status [ER+]: Secondary | ICD-10-CM | POA: Diagnosis not present

## 2019-02-23 DIAGNOSIS — Z9011 Acquired absence of right breast and nipple: Secondary | ICD-10-CM | POA: Diagnosis not present

## 2019-02-23 DIAGNOSIS — Z79810 Long term (current) use of selective estrogen receptor modulators (SERMs): Secondary | ICD-10-CM | POA: Insufficient documentation

## 2019-02-23 DIAGNOSIS — G47419 Narcolepsy without cataplexy: Secondary | ICD-10-CM | POA: Insufficient documentation

## 2019-02-23 DIAGNOSIS — R232 Flushing: Secondary | ICD-10-CM | POA: Diagnosis not present

## 2019-02-23 DIAGNOSIS — K5900 Constipation, unspecified: Secondary | ICD-10-CM | POA: Diagnosis not present

## 2019-02-23 DIAGNOSIS — Z79899 Other long term (current) drug therapy: Secondary | ICD-10-CM | POA: Diagnosis not present

## 2019-02-23 DIAGNOSIS — Z800 Family history of malignant neoplasm of digestive organs: Secondary | ICD-10-CM | POA: Insufficient documentation

## 2019-02-23 DIAGNOSIS — Z923 Personal history of irradiation: Secondary | ICD-10-CM | POA: Insufficient documentation

## 2019-02-23 DIAGNOSIS — K760 Fatty (change of) liver, not elsewhere classified: Secondary | ICD-10-CM | POA: Insufficient documentation

## 2019-02-23 DIAGNOSIS — C50911 Malignant neoplasm of unspecified site of right female breast: Secondary | ICD-10-CM

## 2019-02-23 DIAGNOSIS — F329 Major depressive disorder, single episode, unspecified: Secondary | ICD-10-CM | POA: Insufficient documentation

## 2019-02-23 DIAGNOSIS — Z9221 Personal history of antineoplastic chemotherapy: Secondary | ICD-10-CM | POA: Insufficient documentation

## 2019-02-23 NOTE — Progress Notes (Signed)
Kylie Howard, [place] [id number]   CLINIC:  Medical Oncology/Hematology  PCP:  Kylie Bis, MD [place] [id number] [phone]   REASON FOR VISIT:  Follow-up for right breast cancer.  CURRENT THERAPY: Tamoxifen daily.  BRIEF ONCOLOGIC HISTORY:  Oncology History  Invasive ductal carcinoma of breast, female, right ([place])  10/10/2016 Procedure   Needle core biopsy of right breast 5 o'clock position   10/12/2016 Pathology Results   Invasive ductal carcinoma, grade 2 with DCIS.  ER 95% POSITIVE, PR 60% POSITIVE, Ki-67: 25%, HER-2 NEGATIVE.   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   02/01/2017 Surgery   Right partial mastectomy/oncoplastic reduction Final path: pT1c pN0 ER/PR +, HER2 -       02/13/2017 Oncotype testing   Recurrence score of 21   04/11/2017 - 05/29/2017 Radiation Therapy    Total of 29 treatments given at [facility].      CANCER STAGING: Cancer Staging Invasive ductal carcinoma of breast, female, right [facility]) Staging form: Breast, AJCC 8th Edition - Pathologic stage from 02/01/2017: Stage IA (pT1c, pN0(sn), cM0, G2, ER: Positive, PR: Positive, HER2: Negative, Oncotype DX score: 21) - Signed by Kylie First, MD on 02/27/2017 - Clinical: cT1c, cN0, cM0, ER: Positive, PR: Positive, HER2: Negative - Signed by Kylie First, MD on 02/27/2017    INTERVAL HISTORY:  [name] 48 y.o. female seen for follow-up of right breast cancer.  She received Zoladex until 03/03/2018 and was discontinued secondary to joint pains.  Joint pains has improved since Zoladex was stopped.  She has hot flashes day and night, worse at nighttime with tamoxifen.  No other side effects were noted.  No vaginal bleeding.  No new onset pains.  Appetite is 100%.  Occasional constipation is  stable.  Denies any nausea, vomiting, diarrhea or constipation.  Denies any bleeding per rectum or melena.    REVIEW OF SYSTEMS:  Review of Systems  Gastrointestinal: Positive for constipation.  Endocrine: Positive for hot flashes.  All other systems reviewed and are negative.    PAST MEDICAL/SURGICAL HISTORY:  Past Medical History:  Diagnosis Date  . Breast cancer ([place])    right  . Depression   . Invasive ductal carcinoma of breast, female, right ([place]) 10/17/2016  . Narcolepsy    Past Surgical History:  Procedure Laterality Date  . CESAREAN SECTION  2000,2005  . CHOLECYSTECTOMY       SOCIAL HISTORY:  Social History   Socioeconomic History  . Marital status: Married    Spouse name: Not on file  . Number of children: Not on file  . Years of education: Not on file  . Highest education level: Not on file  Occupational History  . Not on file  Social Needs  . Financial resource strain: Not on file  . Food insecurity    Worry: Not on file    Inability: Not on file  . Transportation needs    Medical: Not on file    Non-medical: Not on file  Tobacco Use  . Smoking status: Never Smoker  . Smokeless tobacco: Never Used  Substance and Sexual Activity  . Alcohol use: No  . Drug use: No  . Sexual activity: Yes  Lifestyle  . Physical activity    Days per week: Not on file    Minutes per  session: Not on file  . Stress: Not on file  Relationships  . Social Herbalist on phone: Not on file    Gets together: Not on file    Attends religious service: Not on file    Active member of club or organization: Not on file    Attends meetings of clubs or organizations: Not on file    Relationship status: Not on file  . Intimate partner violence    Fear of current or ex partner: Not on file    Emotionally abused: Not on file    Physically abused: Not on file    Forced sexual activity: Not on file  Other Topics Concern  . Not on file  Social History Narrative  .  Not on file    FAMILY HISTORY:  Family History  Problem Relation Age of Onset  . Cancer Maternal Grandmother 75       appendiceal with metastases d.72s  . Cancer Paternal Grandfather 80       laryngeal    CURRENT MEDICATIONS:  Outpatient Encounter Medications as of 02/23/2019  Medication Sig  . amphetamine-dextroamphetamine (ADDERALL) 30 MG tablet Take 30 mg by mouth 3 (three) times daily.   . DULoxetine (CYMBALTA) 60 MG capsule Take 60 mg by mouth 2 (two) times daily.   . SUMAtriptan (IMITREX) 100 MG tablet Take 100 mg by mouth every 2 (two) hours as needed.   . tamoxifen (NOLVADEX) 20 MG tablet Take 20 mg by mouth daily.   . [DISCONTINUED] gabapentin (NEURONTIN) 300 MG capsule Take 1 capsule (300 mg total) by mouth 2 (two) times daily. (Patient not taking: Reported on 04/02/2018)   No facility-administered encounter medications on file as of 02/23/2019.     ALLERGIES:  Allergies  Allergen Reactions  . Bee Venom Swelling  . Wellbutrin [Bupropion]     tremors     PHYSICAL EXAM:  ECOG Performance status: 1  Vitals:   02/23/19 1523  BP: (!) 137/96  Pulse: 97  Resp: 16  Temp: 98.2 F (36.8 C)  SpO2: 100%   Filed Weights   02/23/19 1523  Weight: 195 lb 3.2 oz (88.5 kg)    Physical Exam Vitals signs reviewed.  Constitutional:      Appearance: Normal appearance.  Cardiovascular:     Rate and Rhythm: Normal rate and regular rhythm.     Heart sounds: Normal heart sounds.  Pulmonary:     Effort: Pulmonary effort is normal.     Breath sounds: Normal breath sounds.  Abdominal:     General: There is no distension.     Palpations: Abdomen is soft. There is no mass.  Musculoskeletal:        General: No swelling.  Skin:    General: Skin is warm.  Neurological:     General: No focal deficit present.     Mental Status: She is alert and oriented to person, place, and time.  Psychiatric:        Mood and Affect: Mood normal.        Behavior: Behavior normal.   Right  breast lumpectomy site is within normal limits with no palpable mass in bilateral breast.  No palpable adenopathy.   LABORATORY DATA:  I have reviewed the labs as listed.  CBC    Component Value Date/Time   WBC 7.2 01/21/2019 1009   RBC 4.67 01/21/2019 1009   HGB 13.0 01/21/2019 1009   HCT 40.8 01/21/2019 1009   PLT 325 01/21/2019  1009   MCV 87.4 01/21/2019 1009   MCH 27.8 01/21/2019 1009   MCHC 31.9 01/21/2019 1009   RDW 13.5 01/21/2019 1009   LYMPHSABS 1.2 01/21/2019 1009   MONOABS 0.5 01/21/2019 1009   EOSABS 0.1 01/21/2019 1009   BASOSABS 0.1 01/21/2019 1009   CMP Latest Ref Rng & Units 01/21/2019 07/16/2018 04/02/2018  Glucose 70 - 99 mg/dL 114(H) 125(H) 107(H)  BUN 6 - 20 mg/dL _0 Creatinine 0.44 - 1.00 mg/dL 0.71 0.79 0.76  Sodium 135 - 145 mmol/L 141 140 142  Potassium 3.5 - 5.1 mmol/L 3.6 3.5 3.6  Chloride 98 - 111 mmol/L 105 106 105  CO2 22 - 32 mmol/L _1 Calcium 8.9 - 10.3 mg/dL 8.9 8.7(L) 9.2  Total Protein 6.5 - 8.1 g/dL 7.1 7.0 7.4  Total Bilirubin 0.3 - 1.2 mg/dL 0.8 0.8 0.6  Alkaline Phos 38 - 126 U/L 92 80 96  AST 15 - 41 U/L 56(H) 35 49(H)  ALT 0 - 44 U/L 57(H) 50(H) 73(H)       DIAGNOSTIC IMAGING:  I have independently reviewed the scans and discussed with the patient.   ASSESSMENT & PLAN:   Invasive ductal carcinoma of breast, female, right ([place]) 1.  Stage Ia (T1CN0) right breast cancer: - Diagnosed by biopsy on 10/10/2016, ER/PR positive, HER-2 negative, Ki-67 of 25%. -Right lumpectomy and SLNB on 02/01/2017 at [place] DX on 02/19/2017 with a score of 21. - Tamoxifen with Zoladex (03/12/2017 through 03/03/2018) -Germline mutation testing on 11/20/2016 was negative. - Right lumpectomy site in the lateral breast is stable.  No palpable mass in bilateral breasts. -She claimed that she had mammogram in December of last year.  We could not find it on her system.  Her last mammogram per chart was in March 2019 which was  BI-RADS Category 2. -We will go ahead and order her next mammogram.  I have reviewed her blood work which is stable other than elevated liver enzymes. -She is tolerating tamoxifen reasonably well.  She has some hot flashes day and night, worse at nighttime.  They are not waking her up as she has narcolepsy.  2.  Elevated liver enzymes: - CTAP on 01/08/2018 shows severe hepatic steatosis. - She had elevated liver enzymes on the most recent blood work.  I have counseled her to lose close to 20% of her body weight.  3.  Family history: - Maternal grandfather had appendiceal cancer, later spread to become peritoneal carcinomatosis. -1 of the sisters of maternal grandmother had pancreatic cancer.  Another sister of maternal grandmother had lymph node cancer. -Maternal second cousin has breast cancer. -Germline mutation testing on 11/20/2016 was negative.      Orders placed this encounter:  Orders Placed This Encounter  Procedures  . MM DIAG BREAST TOMO BILATERAL      Kylie Jack, MD [place] [phone]

## 2019-02-23 NOTE — Assessment & Plan Note (Addendum)
1.  Stage Ia (T1CN0) right breast cancer: - Diagnosed by biopsy on 10/10/2016, ER/PR positive, HER-2 negative, Ki-67 of 25%. -Right lumpectomy and SLNB on 02/01/2017 at [place] DX on 02/19/2017 with a score of 21. - Tamoxifen with Zoladex (03/12/2017 through 03/03/2018) -Germline mutation testing on 11/20/2016 was negative. - Right lumpectomy site in the lateral breast is stable.  No palpable mass in bilateral breasts. -She claimed that she had mammogram in December of last year.  We could not find it on her system.  Her last mammogram per chart was in March 2019 which was BI-RADS Category 2. -We will go ahead and order her next mammogram.  I have reviewed her blood work which is stable other than elevated liver enzymes. -She is tolerating tamoxifen reasonably well.  She has some hot flashes day and night, worse at nighttime.  They are not waking her up as she has narcolepsy.  2.  Elevated liver enzymes: - CTAP on 01/08/2018 shows severe hepatic steatosis. - She had elevated liver enzymes on the most recent blood work.  I have counseled her to lose close to 20% of her body weight.  3.  Family history: - Maternal grandfather had appendiceal cancer, later spread to become peritoneal carcinomatosis. -1 of the sisters of maternal grandmother had pancreatic cancer.  Another sister of maternal grandmother had lymph node cancer. -Maternal second cousin has breast cancer. -Germline mutation testing on 11/20/2016 was negative.

## 2019-02-23 NOTE — Patient Instructions (Signed)
[facility] at [facility] Discharge Instructions  You were seen today by Dr. Katragadda. He went over your recent lab results. He will see you back in 6 months for labs and follow up.   Thank you for choosing [facility] to provide your oncology and hematology care.  To afford each patient quality time with our provider, please arrive at least 15 minutes before your scheduled appointment time.   If you have a lab appointment with the Cancer Center please come in thru the  Main Entrance and check in at the main information desk  You need to re-schedule your appointment should you arrive 10 or more minutes late.  We strive to give you quality time with our providers, and arriving late affects you and other patients whose appointments are after yours.  Also, if you no show three or more times for appointments you may be dismissed from the clinic at the providers discretion.     Again, thank you for choosing [facility].  Our hope is that these requests will decrease the amount of time that you wait before being seen by our physicians.       _____________________________________________________________  Should you have questions after your visit to [facility], please contact our office at [phone] between the hours of 8:00 a.m. and 4:30 p.m.  Voicemails left after 4:00 p.m. will not be returned until the following business day.  For prescription refill requests, have your pharmacy contact our office and allow 72 hours.    Cancer Center Support Programs:   > Cancer Support Group  2nd Tuesday of the month 1pm-2pm, Journey Room    

## 2019-03-04 DIAGNOSIS — G47419 Narcolepsy without cataplexy: Secondary | ICD-10-CM | POA: Diagnosis not present

## 2019-03-04 DIAGNOSIS — F331 Major depressive disorder, recurrent, moderate: Secondary | ICD-10-CM | POA: Diagnosis not present

## 2019-03-04 DIAGNOSIS — E8881 Metabolic syndrome: Secondary | ICD-10-CM | POA: Diagnosis not present

## 2019-03-04 DIAGNOSIS — F900 Attention-deficit hyperactivity disorder, predominantly inattentive type: Secondary | ICD-10-CM | POA: Diagnosis not present

## 2019-03-04 DIAGNOSIS — G252 Other specified forms of tremor: Secondary | ICD-10-CM | POA: Diagnosis not present

## 2019-03-10 ENCOUNTER — Ambulatory Visit (HOSPITAL_COMMUNITY): Admission: RE | Admit: 2019-03-10 | Payer: BC Managed Care – PPO | Source: Ambulatory Visit

## 2019-03-10 ENCOUNTER — Other Ambulatory Visit (HOSPITAL_COMMUNITY): Payer: BC Managed Care – PPO

## 2019-03-10 ENCOUNTER — Other Ambulatory Visit: Payer: Self-pay

## 2019-03-10 ENCOUNTER — Ambulatory Visit (HOSPITAL_COMMUNITY): Payer: BC Managed Care – PPO

## 2019-03-10 ENCOUNTER — Ambulatory Visit (HOSPITAL_COMMUNITY)
Admission: RE | Admit: 2019-03-10 | Discharge: 2019-03-10 | Disposition: A | Discharge status: Home / Self Care | Payer: BC Managed Care – PPO | Source: Ambulatory Visit | Attending: Hematology | Admitting: Hematology

## 2019-03-10 DIAGNOSIS — R928 Other abnormal and inconclusive findings on diagnostic imaging of breast: Secondary | ICD-10-CM | POA: Diagnosis not present

## 2019-03-10 DIAGNOSIS — C50911 Malignant neoplasm of unspecified site of right female breast: Secondary | ICD-10-CM | POA: Diagnosis not present

## 2019-06-09 DIAGNOSIS — R509 Fever, unspecified: Secondary | ICD-10-CM | POA: Diagnosis not present

## 2019-06-09 DIAGNOSIS — N390 Urinary tract infection, site not specified: Secondary | ICD-10-CM | POA: Diagnosis not present

## 2019-08-12 IMAGING — CT CT CHEST W/ CM
2 of 11 series · 14 of 46 positions shown, 16 images · IV contrast (Isovue)
Comparison: No priors.

CLINICAL DATA: 46-year-old female with history of breast cancer
diagnosed in 0642 treated with surgical resection and radiation
therapy. Restaging examination.

EXAM:
CT CHEST, ABDOMEN, AND PELVIS WITH CONTRAST
TECHNIQUE: Multidetector CT imaging of the chest, abdomen and pelvis was
performed following the standard protocol during bolus
administration of intravenous contrast.
CONTRAST:  100mL [id number] IOPAMIDOL ([id number]) INJECTION 61%

[Series 7: coronal arterial · coronal · arterial · 0.53mm/px · 3 of 103 slices shown]
[im 26/103  soft-tissue]
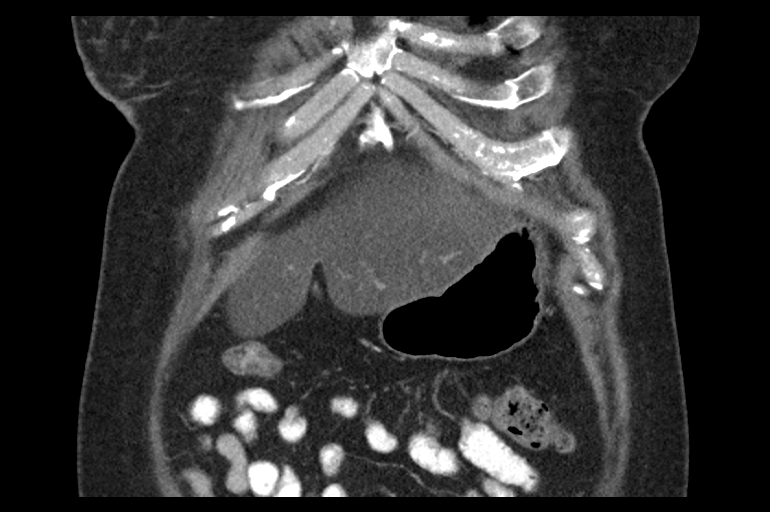
[im 52/103  soft-tissue]
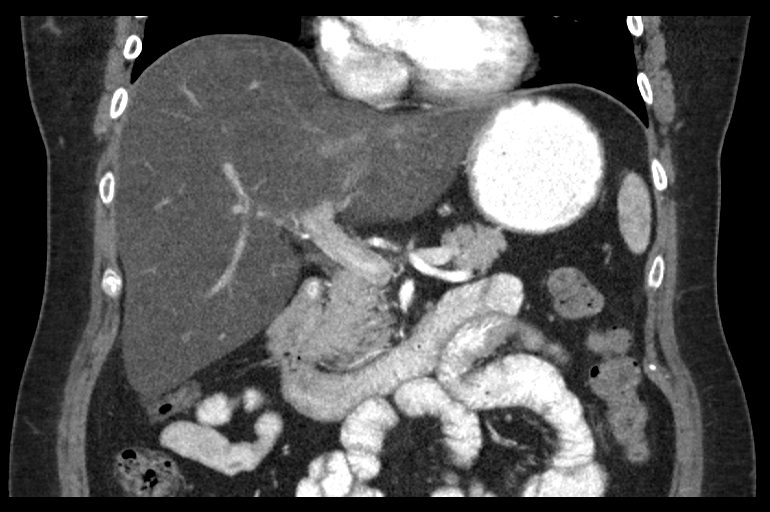
[im 77/103  soft-tissue]
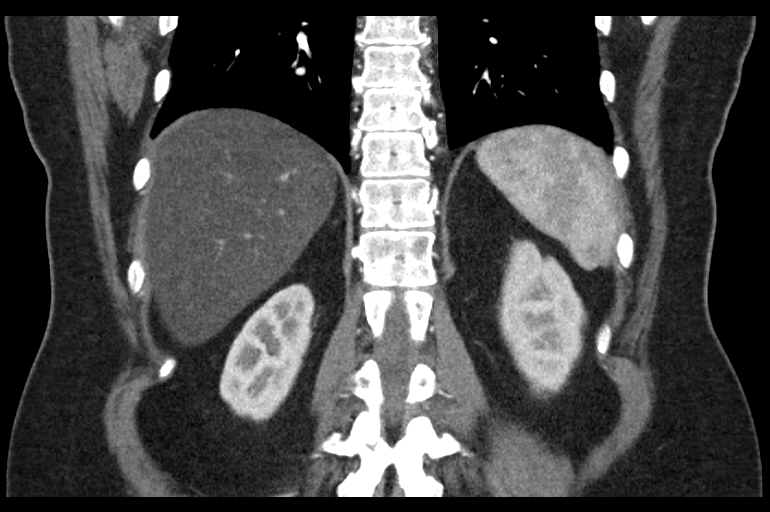

[Series 9: axial venous · axial · portal-venous · 0.81mm/px · z∈[+1109,+1610]mm · 11 of 201 slices shown, 13 images]
[im 17/201  soft-tissue]
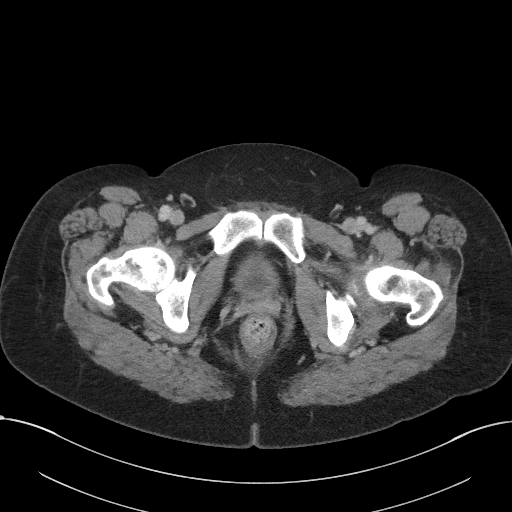
[im 17/201  bone]
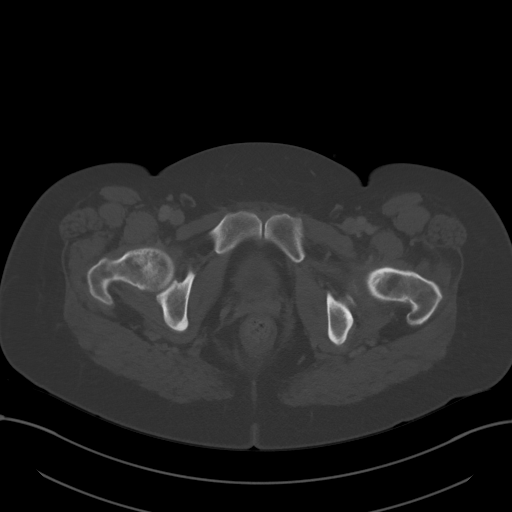
[im 34/201  soft-tissue]
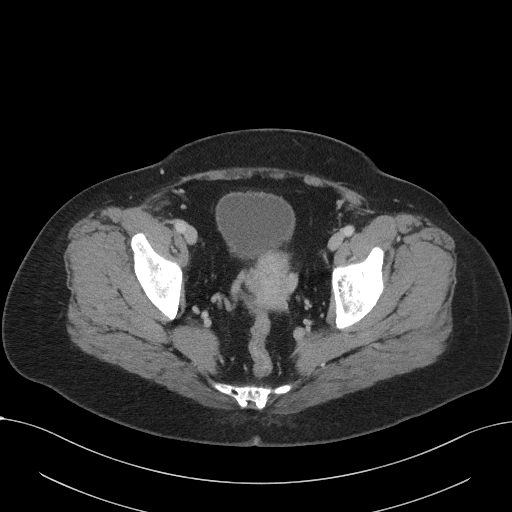
[im 51/201  soft-tissue]
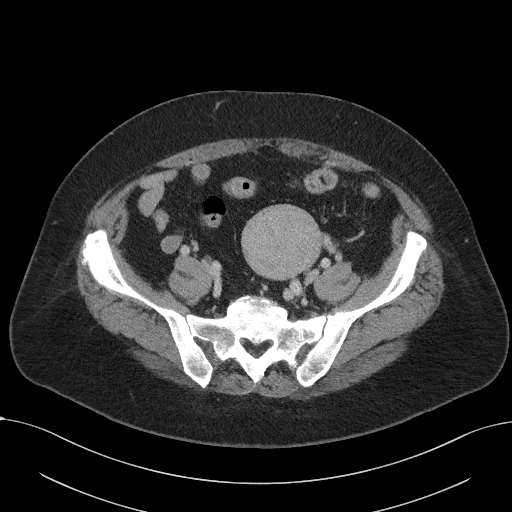
[im 67/201  soft-tissue]
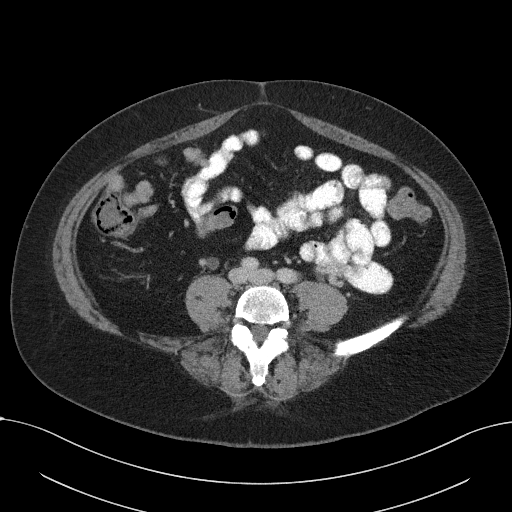
[im 84/201  soft-tissue]
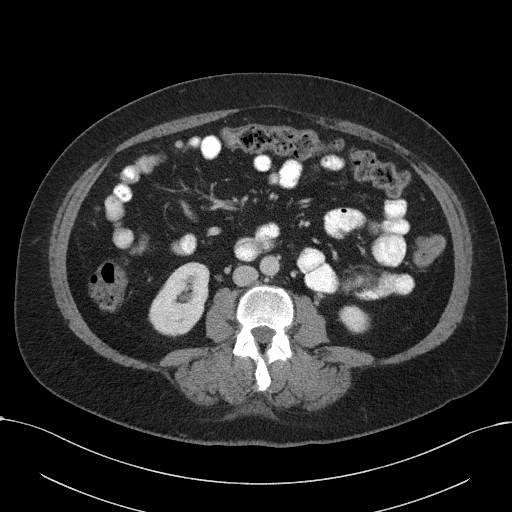
[im 101/201  soft-tissue]
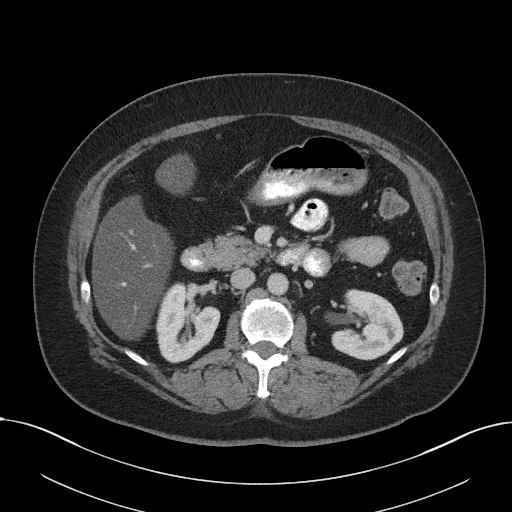
[im 117/201  soft-tissue]
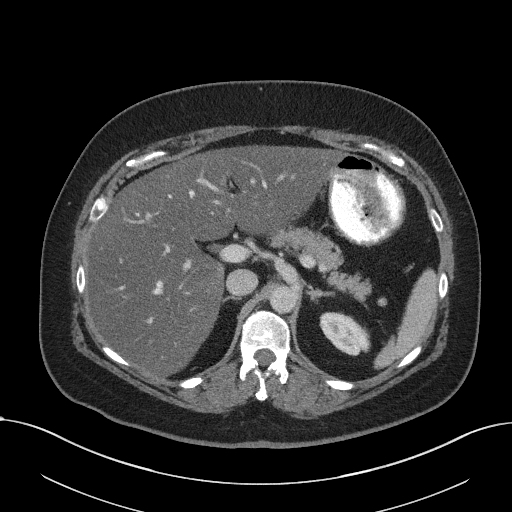
[im 134/201  soft-tissue]
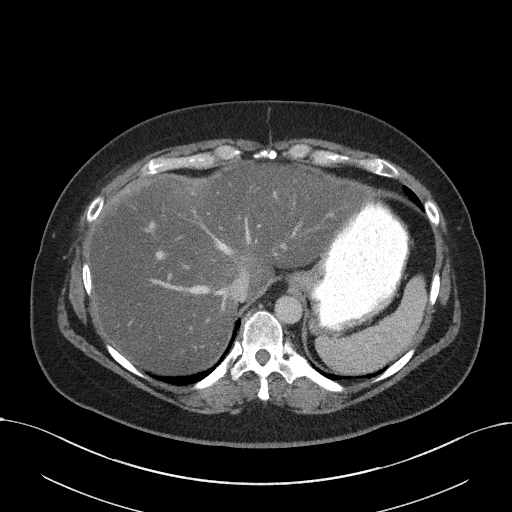
[im 151/201  soft-tissue]
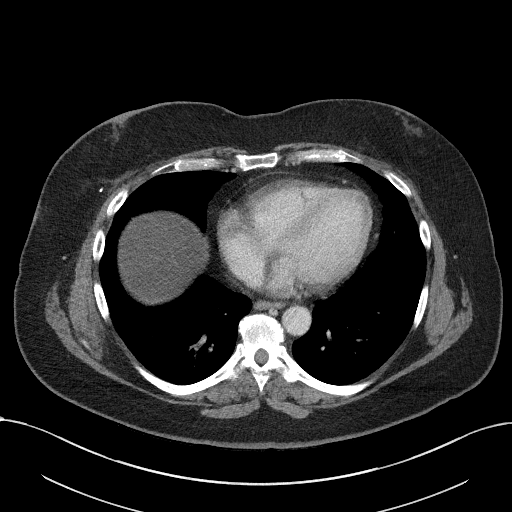
[im 151/201  bone]
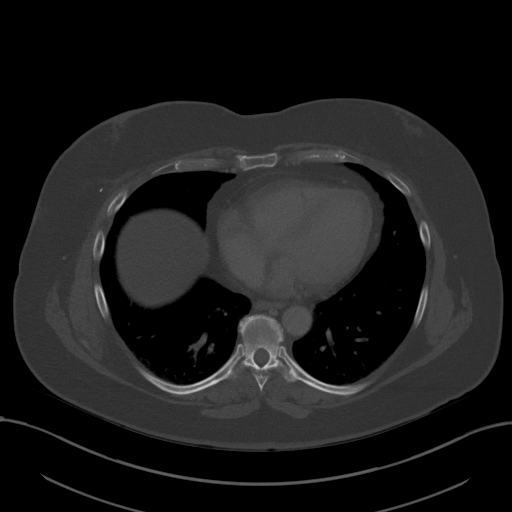
[im 167/201  soft-tissue]
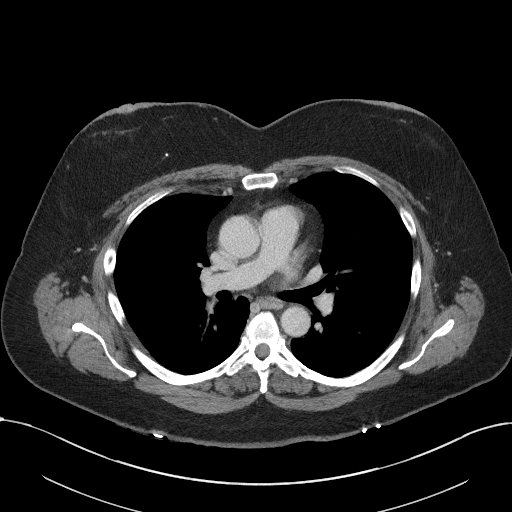
[im 184/201  soft-tissue]
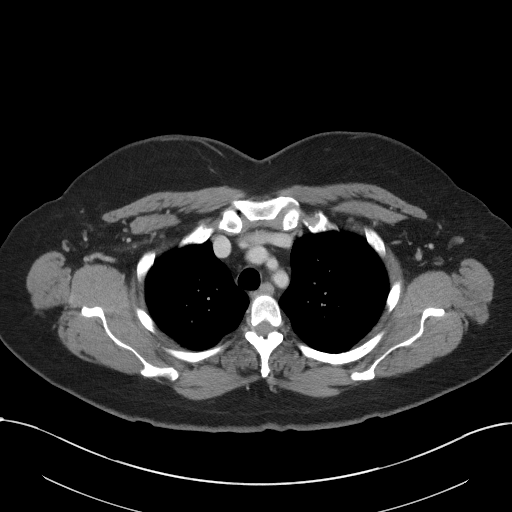

[14 of 46 positions shown; findings below may reference images not displayed]

FINDINGS: CT CHEST FINDINGS

Cardiovascular: Heart size is normal. There is no significant
pericardial fluid, thickening or pericardial calcification. No
atherosclerotic calcifications noted in the thoracic aorta or the
coronary arteries.

Mediastinum/Nodes: No pathologically enlarged mediastinal or hilar
lymph nodes. Esophagus is unremarkable in appearance. No axillary
lymphadenopathy. Surgical clips in the right axillary region related
to prior lymph node dissection.

Lungs/Pleura: No suspicious appearing pulmonary nodules or masses
are noted. No acute consolidative airspace disease. No pleural
effusions.

Musculoskeletal: Postoperative changes of lumpectomy in the right
breast. There are no aggressive appearing lytic or blastic lesions
noted in the visualized portions of the skeleton.

CT ABDOMEN PELVIS FINDINGS

Hepatobiliary: Severe diffuse low attenuation throughout the hepatic
parenchyma, compatible with a background of severe hepatic
steatosis. No suspicious cystic or solid hepatic lesions. No intra
or extrahepatic biliary ductal dilatation. Status post
cholecystectomy.

Pancreas: No pancreatic mass. No pancreatic ductal dilatation. No
pancreatic or peripancreatic fluid or inflammatory changes.

Spleen: Unremarkable.

Adrenals/Urinary Tract: Bilateral kidneys and bilateral adrenal
glands are normal in appearance. No hydroureteronephrosis. Urinary
bladder is normal in appearance.

Stomach/Bowel: Normal appearance of the stomach. No pathologic
dilatation of small bowel or colon. Normal appendix.

Vascular/Lymphatic: No significant atherosclerotic disease, aneurysm
or dissection noted in the abdominal or pelvic vasculature. No
lymphadenopathy noted in the abdomen or pelvis.

Reproductive: Uterus and ovaries are unremarkable in appearance. 9
mm low-attenuation lesion in the region of the cervix, nonspecific
but likely a tiny Nabothian cyst. Ovaries are unremarkable in
appearance.

Other: No significant volume of ascites.  No pneumoperitoneum.

Musculoskeletal: There are no aggressive appearing lytic or blastic
lesions noted in the visualized portions of the skeleton.
IMPRESSION: 1. Status post right lumpectomy and right axillary lymph node
dissection. No findings to suggest local recurrence of disease and
no metastatic disease identified in the chest, abdomen or pelvis.
2. Severe hepatic steatosis.
3. Additional incidental findings, as above.

## 2019-08-18 ENCOUNTER — Ambulatory Visit: Payer: BC Managed Care – PPO | Attending: Internal Medicine

## 2019-08-25 ENCOUNTER — Other Ambulatory Visit (HOSPITAL_COMMUNITY): Payer: BC Managed Care – PPO

## 2019-09-01 ENCOUNTER — Ambulatory Visit (HOSPITAL_COMMUNITY): Payer: BC Managed Care – PPO | Admitting: Hematology

## 2019-09-09 ENCOUNTER — Other Ambulatory Visit: Payer: Self-pay

## 2019-09-09 ENCOUNTER — Ambulatory Visit: Payer: BC Managed Care – PPO | Attending: Internal Medicine

## 2019-09-09 DIAGNOSIS — Z20822 Contact with and (suspected) exposure to covid-19: Secondary | ICD-10-CM

## 2019-09-10 LAB — NOVEL CORONAVIRUS, NAA: SARS-CoV-2, NAA: NOT DETECTED

## 2019-10-07 DIAGNOSIS — K7581 Nonalcoholic steatohepatitis (NASH): Secondary | ICD-10-CM | POA: Diagnosis not present

## 2019-10-07 DIAGNOSIS — F331 Major depressive disorder, recurrent, moderate: Secondary | ICD-10-CM | POA: Diagnosis not present

## 2019-10-07 DIAGNOSIS — E8881 Metabolic syndrome: Secondary | ICD-10-CM | POA: Diagnosis not present

## 2019-10-07 DIAGNOSIS — G47419 Narcolepsy without cataplexy: Secondary | ICD-10-CM | POA: Diagnosis not present

## 2019-10-14 DIAGNOSIS — F331 Major depressive disorder, recurrent, moderate: Secondary | ICD-10-CM | POA: Diagnosis not present

## 2019-10-14 DIAGNOSIS — G47419 Narcolepsy without cataplexy: Secondary | ICD-10-CM | POA: Diagnosis not present

## 2019-10-14 DIAGNOSIS — F900 Attention-deficit hyperactivity disorder, predominantly inattentive type: Secondary | ICD-10-CM | POA: Diagnosis not present

## 2019-10-14 DIAGNOSIS — E8881 Metabolic syndrome: Secondary | ICD-10-CM | POA: Diagnosis not present

## 2019-10-14 DIAGNOSIS — R4582 Worries: Secondary | ICD-10-CM | POA: Diagnosis not present

## 2020-01-08 DIAGNOSIS — S233XXA Sprain of ligaments of thoracic spine, initial encounter: Secondary | ICD-10-CM | POA: Diagnosis not present

## 2020-01-08 DIAGNOSIS — S338XXA Sprain of other parts of lumbar spine and pelvis, initial encounter: Secondary | ICD-10-CM | POA: Diagnosis not present

## 2020-01-08 DIAGNOSIS — S134XXA Sprain of ligaments of cervical spine, initial encounter: Secondary | ICD-10-CM | POA: Diagnosis not present

## 2020-01-13 DIAGNOSIS — S338XXA Sprain of other parts of lumbar spine and pelvis, initial encounter: Secondary | ICD-10-CM | POA: Diagnosis not present

## 2020-01-13 DIAGNOSIS — S134XXA Sprain of ligaments of cervical spine, initial encounter: Secondary | ICD-10-CM | POA: Diagnosis not present

## 2020-01-13 DIAGNOSIS — S233XXA Sprain of ligaments of thoracic spine, initial encounter: Secondary | ICD-10-CM | POA: Diagnosis not present

## 2020-01-21 DIAGNOSIS — S233XXA Sprain of ligaments of thoracic spine, initial encounter: Secondary | ICD-10-CM | POA: Diagnosis not present

## 2020-01-21 DIAGNOSIS — S134XXA Sprain of ligaments of cervical spine, initial encounter: Secondary | ICD-10-CM | POA: Diagnosis not present

## 2020-01-21 DIAGNOSIS — F900 Attention-deficit hyperactivity disorder, predominantly inattentive type: Secondary | ICD-10-CM | POA: Diagnosis not present

## 2020-01-21 DIAGNOSIS — F331 Major depressive disorder, recurrent, moderate: Secondary | ICD-10-CM | POA: Diagnosis not present

## 2020-01-21 DIAGNOSIS — Z9189 Other specified personal risk factors, not elsewhere classified: Secondary | ICD-10-CM | POA: Diagnosis not present

## 2020-01-21 DIAGNOSIS — G47419 Narcolepsy without cataplexy: Secondary | ICD-10-CM | POA: Diagnosis not present

## 2020-01-21 DIAGNOSIS — E8881 Metabolic syndrome: Secondary | ICD-10-CM | POA: Diagnosis not present

## 2020-01-21 DIAGNOSIS — Z1331 Encounter for screening for depression: Secondary | ICD-10-CM | POA: Diagnosis not present

## 2020-01-21 DIAGNOSIS — S338XXA Sprain of other parts of lumbar spine and pelvis, initial encounter: Secondary | ICD-10-CM | POA: Diagnosis not present

## 2020-02-02 DIAGNOSIS — S134XXA Sprain of ligaments of cervical spine, initial encounter: Secondary | ICD-10-CM | POA: Diagnosis not present

## 2020-02-02 DIAGNOSIS — S338XXA Sprain of other parts of lumbar spine and pelvis, initial encounter: Secondary | ICD-10-CM | POA: Diagnosis not present

## 2020-02-02 DIAGNOSIS — S233XXA Sprain of ligaments of thoracic spine, initial encounter: Secondary | ICD-10-CM | POA: Diagnosis not present

## 2020-02-05 DIAGNOSIS — S134XXA Sprain of ligaments of cervical spine, initial encounter: Secondary | ICD-10-CM | POA: Diagnosis not present

## 2020-02-05 DIAGNOSIS — S338XXA Sprain of other parts of lumbar spine and pelvis, initial encounter: Secondary | ICD-10-CM | POA: Diagnosis not present

## 2020-02-05 DIAGNOSIS — S233XXA Sprain of ligaments of thoracic spine, initial encounter: Secondary | ICD-10-CM | POA: Diagnosis not present

## 2020-02-08 DIAGNOSIS — S338XXA Sprain of other parts of lumbar spine and pelvis, initial encounter: Secondary | ICD-10-CM | POA: Diagnosis not present

## 2020-02-08 DIAGNOSIS — S134XXA Sprain of ligaments of cervical spine, initial encounter: Secondary | ICD-10-CM | POA: Diagnosis not present

## 2020-02-08 DIAGNOSIS — S233XXA Sprain of ligaments of thoracic spine, initial encounter: Secondary | ICD-10-CM | POA: Diagnosis not present

## 2020-02-22 DIAGNOSIS — S338XXA Sprain of other parts of lumbar spine and pelvis, initial encounter: Secondary | ICD-10-CM | POA: Diagnosis not present

## 2020-02-22 DIAGNOSIS — S134XXA Sprain of ligaments of cervical spine, initial encounter: Secondary | ICD-10-CM | POA: Diagnosis not present

## 2020-02-22 DIAGNOSIS — S233XXA Sprain of ligaments of thoracic spine, initial encounter: Secondary | ICD-10-CM | POA: Diagnosis not present

## 2020-03-07 DIAGNOSIS — S233XXA Sprain of ligaments of thoracic spine, initial encounter: Secondary | ICD-10-CM | POA: Diagnosis not present

## 2020-03-07 DIAGNOSIS — S134XXA Sprain of ligaments of cervical spine, initial encounter: Secondary | ICD-10-CM | POA: Diagnosis not present

## 2020-03-07 DIAGNOSIS — S338XXA Sprain of other parts of lumbar spine and pelvis, initial encounter: Secondary | ICD-10-CM | POA: Diagnosis not present

## 2020-03-22 DIAGNOSIS — S233XXA Sprain of ligaments of thoracic spine, initial encounter: Secondary | ICD-10-CM | POA: Diagnosis not present

## 2020-03-22 DIAGNOSIS — S338XXA Sprain of other parts of lumbar spine and pelvis, initial encounter: Secondary | ICD-10-CM | POA: Diagnosis not present

## 2020-03-22 DIAGNOSIS — S134XXA Sprain of ligaments of cervical spine, initial encounter: Secondary | ICD-10-CM | POA: Diagnosis not present

## 2020-03-24 DIAGNOSIS — S233XXA Sprain of ligaments of thoracic spine, initial encounter: Secondary | ICD-10-CM | POA: Diagnosis not present

## 2020-03-24 DIAGNOSIS — S338XXA Sprain of other parts of lumbar spine and pelvis, initial encounter: Secondary | ICD-10-CM | POA: Diagnosis not present

## 2020-03-24 DIAGNOSIS — S134XXA Sprain of ligaments of cervical spine, initial encounter: Secondary | ICD-10-CM | POA: Diagnosis not present

## 2020-04-20 DIAGNOSIS — Z13220 Encounter for screening for lipoid disorders: Secondary | ICD-10-CM | POA: Diagnosis not present

## 2020-04-20 DIAGNOSIS — K7581 Nonalcoholic steatohepatitis (NASH): Secondary | ICD-10-CM | POA: Diagnosis not present

## 2020-04-20 DIAGNOSIS — R946 Abnormal results of thyroid function studies: Secondary | ICD-10-CM | POA: Diagnosis not present

## 2020-04-20 DIAGNOSIS — Z20822 Contact with and (suspected) exposure to covid-19: Secondary | ICD-10-CM | POA: Diagnosis not present

## 2020-04-20 DIAGNOSIS — E8881 Metabolic syndrome: Secondary | ICD-10-CM | POA: Diagnosis not present

## 2020-04-20 DIAGNOSIS — F1920 Other psychoactive substance dependence, uncomplicated: Secondary | ICD-10-CM | POA: Diagnosis not present

## 2020-04-20 DIAGNOSIS — R739 Hyperglycemia, unspecified: Secondary | ICD-10-CM | POA: Diagnosis not present

## 2020-04-20 DIAGNOSIS — B182 Chronic viral hepatitis C: Secondary | ICD-10-CM | POA: Diagnosis not present

## 2020-04-26 DIAGNOSIS — C50511 Malignant neoplasm of lower-outer quadrant of right female breast: Secondary | ICD-10-CM | POA: Diagnosis not present

## 2020-04-26 DIAGNOSIS — Z23 Encounter for immunization: Secondary | ICD-10-CM | POA: Diagnosis not present

## 2020-04-26 DIAGNOSIS — F900 Attention-deficit hyperactivity disorder, predominantly inattentive type: Secondary | ICD-10-CM | POA: Diagnosis not present

## 2020-04-26 DIAGNOSIS — M79672 Pain in left foot: Secondary | ICD-10-CM | POA: Diagnosis not present

## 2020-04-26 DIAGNOSIS — F331 Major depressive disorder, recurrent, moderate: Secondary | ICD-10-CM | POA: Diagnosis not present

## 2020-04-26 DIAGNOSIS — R4582 Worries: Secondary | ICD-10-CM | POA: Diagnosis not present

## 2020-04-26 DIAGNOSIS — E8881 Metabolic syndrome: Secondary | ICD-10-CM | POA: Diagnosis not present

## 2020-05-20 DIAGNOSIS — S233XXA Sprain of ligaments of thoracic spine, initial encounter: Secondary | ICD-10-CM | POA: Diagnosis not present

## 2020-05-20 DIAGNOSIS — S134XXA Sprain of ligaments of cervical spine, initial encounter: Secondary | ICD-10-CM | POA: Diagnosis not present

## 2020-05-20 DIAGNOSIS — S338XXA Sprain of other parts of lumbar spine and pelvis, initial encounter: Secondary | ICD-10-CM | POA: Diagnosis not present

## 2020-05-26 DIAGNOSIS — S338XXA Sprain of other parts of lumbar spine and pelvis, initial encounter: Secondary | ICD-10-CM | POA: Diagnosis not present

## 2020-05-26 DIAGNOSIS — S233XXA Sprain of ligaments of thoracic spine, initial encounter: Secondary | ICD-10-CM | POA: Diagnosis not present

## 2020-05-26 DIAGNOSIS — S134XXA Sprain of ligaments of cervical spine, initial encounter: Secondary | ICD-10-CM | POA: Diagnosis not present

## 2020-06-09 DIAGNOSIS — S134XXA Sprain of ligaments of cervical spine, initial encounter: Secondary | ICD-10-CM | POA: Diagnosis not present

## 2020-06-09 DIAGNOSIS — S338XXA Sprain of other parts of lumbar spine and pelvis, initial encounter: Secondary | ICD-10-CM | POA: Diagnosis not present

## 2020-06-09 DIAGNOSIS — S233XXA Sprain of ligaments of thoracic spine, initial encounter: Secondary | ICD-10-CM | POA: Diagnosis not present

## 2020-06-23 DIAGNOSIS — S338XXA Sprain of other parts of lumbar spine and pelvis, initial encounter: Secondary | ICD-10-CM | POA: Diagnosis not present

## 2020-06-23 DIAGNOSIS — S233XXA Sprain of ligaments of thoracic spine, initial encounter: Secondary | ICD-10-CM | POA: Diagnosis not present

## 2020-06-23 DIAGNOSIS — S134XXA Sprain of ligaments of cervical spine, initial encounter: Secondary | ICD-10-CM | POA: Diagnosis not present

## 2020-07-11 DIAGNOSIS — S134XXA Sprain of ligaments of cervical spine, initial encounter: Secondary | ICD-10-CM | POA: Diagnosis not present

## 2020-07-11 DIAGNOSIS — S233XXA Sprain of ligaments of thoracic spine, initial encounter: Secondary | ICD-10-CM | POA: Diagnosis not present

## 2020-07-11 DIAGNOSIS — S338XXA Sprain of other parts of lumbar spine and pelvis, initial encounter: Secondary | ICD-10-CM | POA: Diagnosis not present

## 2020-07-21 DIAGNOSIS — R739 Hyperglycemia, unspecified: Secondary | ICD-10-CM | POA: Diagnosis not present

## 2020-07-21 DIAGNOSIS — Z13220 Encounter for screening for lipoid disorders: Secondary | ICD-10-CM | POA: Diagnosis not present

## 2020-07-21 DIAGNOSIS — Z1159 Encounter for screening for other viral diseases: Secondary | ICD-10-CM | POA: Diagnosis not present

## 2020-07-26 DIAGNOSIS — G47419 Narcolepsy without cataplexy: Secondary | ICD-10-CM | POA: Diagnosis not present

## 2020-07-26 DIAGNOSIS — R4582 Worries: Secondary | ICD-10-CM | POA: Diagnosis not present

## 2020-07-26 DIAGNOSIS — Z23 Encounter for immunization: Secondary | ICD-10-CM | POA: Diagnosis not present

## 2020-07-26 DIAGNOSIS — E8881 Metabolic syndrome: Secondary | ICD-10-CM | POA: Diagnosis not present

## 2020-07-26 DIAGNOSIS — C50511 Malignant neoplasm of lower-outer quadrant of right female breast: Secondary | ICD-10-CM | POA: Diagnosis not present

## 2020-07-26 DIAGNOSIS — F331 Major depressive disorder, recurrent, moderate: Secondary | ICD-10-CM | POA: Diagnosis not present

## 2020-08-01 DIAGNOSIS — S134XXA Sprain of ligaments of cervical spine, initial encounter: Secondary | ICD-10-CM | POA: Diagnosis not present

## 2020-08-01 DIAGNOSIS — S338XXA Sprain of other parts of lumbar spine and pelvis, initial encounter: Secondary | ICD-10-CM | POA: Diagnosis not present

## 2020-08-01 DIAGNOSIS — S233XXA Sprain of ligaments of thoracic spine, initial encounter: Secondary | ICD-10-CM | POA: Diagnosis not present

## 2020-10-20 ENCOUNTER — Encounter: Payer: Self-pay | Admitting: Genetic Counselor

## 2020-10-20 NOTE — Progress Notes (Signed)
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  The updated report date is October 20, 2020.

## 2023-09-05 ENCOUNTER — Inpatient Hospital Stay: Payer: BC Managed Care – PPO

## 2023-09-05 ENCOUNTER — Encounter: Payer: Self-pay | Admitting: Oncology

## 2023-09-05 ENCOUNTER — Inpatient Hospital Stay: Payer: BC Managed Care – PPO | Attending: Oncology | Admitting: Oncology

## 2023-09-05 VITALS — BP 122/84 | HR 87 | Temp 98.3°F | Resp 18 | Wt 165.6 lb

## 2023-09-05 DIAGNOSIS — C50911 Malignant neoplasm of unspecified site of right female breast: Secondary | ICD-10-CM

## 2023-09-05 DIAGNOSIS — Z853 Personal history of malignant neoplasm of breast: Secondary | ICD-10-CM | POA: Diagnosis present

## 2023-09-05 DIAGNOSIS — Z923 Personal history of irradiation: Secondary | ICD-10-CM | POA: Insufficient documentation

## 2023-09-05 NOTE — Progress Notes (Signed)
Patient Care Team: Richardean Chimera, MD as PCP - General (Family Medicine)  Clinic Day:  09/05/2023  Referring physician: Richardean Chimera, MD   CHIEF COMPLAINT:  CC: History of stage Ia right breast carcinoma   ASSESSMENT & PLAN:   Assessment & Plan: Kylie Howard  is a 53 y.o. female with a history of stage Ia right breast carcinoma s/p lumpectomy, radiation and at least 5 years of tamoxifen.  Invasive ductal carcinoma of breast, female, right (HCC) Stage Ia [T1c N0] right breast cancer -ER PR positive, HER2 negative, Ki-67 of 25%.   -Patient is s/p right lumpectomy and SLNB at [facility].  Oncotype DX of 21. Completed 5 years of tamoxifen and Zoladex Cydney Ok got it until 2019, discontinued due to joint pains].   -Recent mammogram from June 2024 was negative for malignancy. -Continue annual mammograms. -Continue vitamin D and calcium intake. -Encouraged to follow up with primary care provider for ongoing management.  Patient wishes to have as less follow-ups as possible.  She is willing to do survivorship with primary care and will reach out to [place] with any questions or concerns.    The patient understands the plans discussed today and is in agreement with them.  She knows to contact our office if she develops concerns prior to her next appointment.  I provided 40 minutes of face-to-face time during this encounter and > 50% was spent counseling as documented under my assessment and plan.    Cindie Crumbly, MD  [facility] [place] - A DEPT OF [facility] [address] [place] [address] Dept: [phone] Dept Fax: [phone]   Orders Placed This Encounter  Procedures   Ambulatory referral to Social Work    Referral Priority:   Routine    Referral Type:   Consultation    Referral Reason:   Specialty Services Required    Referred to Provider:   Cindie Crumbly, MD    Number of Visits Requested:   1      ONCOLOGY HISTORY:   Oncology History  Invasive ductal carcinoma of breast, female, right (HCC)  10/10/2016 Procedure   Needle core biopsy of right breast 5 o'clock position   10/12/2016 Pathology Results   Invasive ductal carcinoma, grade 2 with DCIS.  ER 95% POSITIVE, PR 60% POSITIVE, Ki-67: 25%, HER-2 NEGATIVE.   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  The updated report date is October 20, 2020.   02/01/2017 Surgery   Right partial mastectomy/oncoplastic reduction Final path: pT1c pN0 ER/PR +, HER2 -       02/13/2017 Oncotype testing   Recurrence score of 21   03/06/2017 - 04/07/2023 Chemotherapy   Tamoxifen  Received Goserelin until 2019 and then discontinued due to joint pains   04/11/2017 - 05/29/2017 Radiation Therapy    Total of 29 treatments given at [facility].   01/09/2023 Mammogram   Findings: There are benign postoperative findings and biopsy clips in the right breast.  No suspicious masses, calcifications, or other findings are seen in either breast.  There has been no significant interval change.  Impression: There is no mammographic evidence of malignancy.  A 1 year screening mammogram is recommended.       Current Treatment:  Surveillance  INTERVAL HISTORY:  Kylie Howard is here today for follow up. Patient is accompanied by by her husband today.  Patient was lost to follow-up since 2020.  She reported that since COVID it has been difficult for her to keep up with appointments and was just following up with her primary care.  She continue to take tamoxifen until last year.  She denies fevers or chills. She denies pain. Her appetite is good.   She reports ongoing breast pain and discomfort, which she has experienced since her  lumpectomy. The pain is described as constant and is exacerbated by pressure, such as when lying on her stomach or when her puppy lays on her chest. She also reports feeling lumps or knots in her breasts, which have been previously evaluated and deemed non-concerning.  The patient also mentions a history of irregular menstruation, which ceased entirely while on tamoxifen. Since discontinuing the medication, she has had one or two instances of spotting, but no regular periods.   I have reviewed the past medical history, past surgical history, social history and family history with the patient and they are unchanged from previous note.  ALLERGIES:  is allergic to bee venom and wellbutrin [bupropion].  MEDICATIONS:  Current Outpatient Medications  Medication Sig Dispense Refill   amphetamine-dextroamphetamine (ADDERALL) 30 MG tablet Take 30 mg by mouth 3 (three) times daily.      DULoxetine (CYMBALTA) 60 MG capsule Take 60 mg by mouth 2 (two) times daily.      metFORMIN (GLUCOPHAGE-XR) 500 MG 24 hr tablet Take 500 mg by mouth daily.     modafinil (PROVIGIL) 200 MG tablet Take 200 mg by mouth 2 (two) times daily.     MOUNJARO 7.5 MG/0.5ML Pen  (Patient taking differently: 10 mg once a week.)     SUMAtriptan (IMITREX) 100 MG tablet Take 100 mg by mouth every 2 (two) hours as needed.  (Patient not taking: Reported on 09/05/2023)     No current facility-administered medications for this visit.     REVIEW OF SYSTEMS:   Constitutional: Denies fevers, chills or abnormal weight loss Eyes: Denies blurriness of vision Ears, nose, mouth, throat, and face: Denies mucositis or sore throat Respiratory: Denies cough, dyspnea or wheezes Cardiovascular: Denies palpitation, chest discomfort or lower extremity swelling Gastrointestinal:  Denies nausea, heartburn or change in bowel habits Skin: Denies abnormal skin rashes Lymphatics: Denies new lymphadenopathy or easy bruising Neurological:Denies numbness,  tingling or new weaknesses Behavioral/Psych: Mood is stable, no new changes  All other systems were reviewed with the patient and are negative.   VITALS:  Blood pressure 122/84, pulse 87, temperature 98.3 F (36.8 C), temperature source Oral, resp. rate 18, weight 165 lb 9.1 oz (75.1 kg), SpO2 95%.  Wt Readings from Last 3 Encounters:  09/05/23 165 lb 9.1 oz (75.1 kg)  02/23/19 195 lb 3.2 oz (88.5 kg)  07/16/18 194 lb 1.6 oz (88 kg)    Body mass index is 26.72 kg/m.  Performance status (ECOG): 0 - Asymptomatic  PHYSICAL EXAM:   GENERAL:alert, no distress and comfortable SKIN: skin color, texture, turgor are normal, no rashes or significant lesions BREAST:Right breast: Lumpectomy scar , some tenderness on the medial side, no rash, no discharge, no lumps palpated.  Left breast: No masses palpated, subareolar scar from breast reduction.  Nontender, no discharge.  No bilateral axillary lymphadenopathy palpated LUNGS: clear to auscultation and percussion with normal breathing effort HEART: regular rate & rhythm and no murmurs and no lower extremity edema ABDOMEN:abdomen soft,  non-tender and normal bowel sounds Musculoskeletal:no cyanosis of digits and no clubbing  NEURO: alert & oriented x 3 with fluent speech  I reviewed laboratory and imaging data from records under media from primary care.

## 2023-09-05 NOTE — Assessment & Plan Note (Signed)
Stage Ia [T1c N0] right breast cancer -ER PR positive, HER2 negative, Ki-67 of 25%.   -Patient is s/p right lumpectomy and SLNB at [facility].  Oncotype DX of 21. Completed 5 years of tamoxifen and Zoladex Cydney Ok got it until 2019, discontinued due to joint pains].   -Recent mammogram from June 2024 was negative for malignancy. -Continue annual mammograms. -Continue vitamin D and calcium intake. -Encouraged to follow up with primary care provider for ongoing management.  Patient wishes to have as less follow-ups as possible.  She is willing to do survivorship with primary care and will reach out to [place] with any questions or concerns.

## 2023-09-05 NOTE — Patient Instructions (Addendum)
[facility]  Discharge Instructions  You were seen and examined today by Dr. Anders Simmonds. Dr. Anders Simmonds is a medical oncologist, meaning that he specializes in the treatment of cancer diagnoses. Dr. Anders Simmonds discussed your past medical history, family history of cancers, and the events that led to you being here today.  You were referred to Dr. Anders Simmonds for ongoing management of your previous breast caner. You are beyond 5 years from your cancer diagnosis and are considered cured at this time. Please continue routine, yearly mammograms with your primary care provider.  Please continue taking Vitamin D and Calcium supplements daily.  Follow-up as needed.  Thank you for choosing [facility] to provide your oncology and hematology care.   To afford each patient quality time with our provider, please arrive at least 15 minutes before your scheduled appointment time. You may need to reschedule your appointment if you arrive late (10 or more minutes). Arriving late affects you and other patients whose appointments are after yours.  Also, if you miss three or more appointments without notifying the office, you may be dismissed from the clinic at the provider's discretion.    Again, thank you for choosing [facility].  Our hope is that these requests will decrease the amount of time that you wait before being seen by our physicians.   If you have a lab appointment with the Cancer Center - please note that after April 8th, all labs will be drawn in the cancer center.  You do not have to check in or register with the main entrance as you have in the past but will complete your check-in at the cancer center.            _____________________________________________________________  Should you have questions after your visit to [facility], please contact our office at [phone] and follow the prompts.  Our office hours are 8:00  a.m. to 4:30 p.m. Monday - Thursday and 8:00 a.m. to 2:30 p.m. Friday.  Please note that voicemails left after 4:00 p.m. may not be returned until the following business day.  We are closed weekends and all major holidays.  You do have access to a nurse 24-7, just call the main number to the clinic [phone] and do not press any options, hold on the line and a nurse will answer the phone.    For prescription refill requests, have your pharmacy contact our office and allow 72 hours.    Masks are no longer required in the cancer centers. If you would like for your care team to wear a mask while they are taking care of you, please let them know. You may have one support person who is at least 54 years old accompany you for your appointments.

## 2023-09-11 ENCOUNTER — Inpatient Hospital Stay: Payer: BC Managed Care – PPO | Admitting: Licensed Clinical Social Worker
